# Patient Record
Sex: Male | Born: 1935
Health system: Southern US, Community
[De-identification: ages and names within clinical notes are randomized; demographics above are authoritative.]

## PROBLEM LIST (undated history)

## (undated) DIAGNOSIS — E7800 Pure hypercholesterolemia, unspecified: Secondary | ICD-10-CM

## (undated) DIAGNOSIS — I10 Essential (primary) hypertension: Secondary | ICD-10-CM

## (undated) DIAGNOSIS — C61 Malignant neoplasm of prostate: Secondary | ICD-10-CM

## (undated) DIAGNOSIS — I493 Ventricular premature depolarization: Secondary | ICD-10-CM

## (undated) DIAGNOSIS — I471 Supraventricular tachycardia: Secondary | ICD-10-CM

## (undated) DIAGNOSIS — N320 Bladder-neck obstruction: Secondary | ICD-10-CM

## (undated) HISTORY — PX: PROSTATECTOMY: SHX69

## (undated) HISTORY — DX: Supraventricular tachycardia: I47.1

## (undated) HISTORY — DX: Essential (primary) hypertension: I10

## (undated) HISTORY — DX: Pure hypercholesterolemia, unspecified: E78.00

## (undated) HISTORY — DX: Ventricular premature depolarization: I49.3

## (undated) HISTORY — DX: Bladder-neck obstruction: N32.0

## (undated) HISTORY — PX: HEMORRHOID SURGERY: SHX153

## (undated) HISTORY — DX: Malignant neoplasm of prostate: C61

---

## 1999-09-08 ENCOUNTER — Encounter: Payer: Self-pay | Admitting: Emergency Medicine

## 1999-09-08 ENCOUNTER — Emergency Department (HOSPITAL_COMMUNITY): Admission: EM | Admit: 1999-09-08 | Discharge: 1999-09-08 | Payer: Self-pay | Admitting: Emergency Medicine

## 2002-06-15 ENCOUNTER — Emergency Department (HOSPITAL_COMMUNITY): Admission: EM | Admit: 2002-06-15 | Discharge: 2002-06-15 | Payer: Self-pay | Admitting: *Deleted

## 2003-03-21 ENCOUNTER — Encounter: Admission: RE | Admit: 2003-03-21 | Discharge: 2003-03-21 | Payer: Self-pay | Admitting: Urology

## 2003-03-21 ENCOUNTER — Encounter: Payer: Self-pay | Admitting: Urology

## 2004-09-30 ENCOUNTER — Ambulatory Visit (HOSPITAL_BASED_OUTPATIENT_CLINIC_OR_DEPARTMENT_OTHER): Admission: RE | Admit: 2004-09-30 | Discharge: 2004-09-30 | Payer: Self-pay | Admitting: Urology

## 2004-09-30 ENCOUNTER — Ambulatory Visit (HOSPITAL_COMMUNITY): Admission: RE | Admit: 2004-09-30 | Discharge: 2004-09-30 | Payer: Self-pay | Admitting: Urology

## 2004-09-30 HISTORY — PX: CYSTOURETHROSCOPY: SHX476

## 2005-09-09 ENCOUNTER — Ambulatory Visit (HOSPITAL_COMMUNITY): Admission: RE | Admit: 2005-09-09 | Discharge: 2005-09-09 | Payer: Self-pay | Admitting: Urology

## 2005-09-30 ENCOUNTER — Ambulatory Visit: Admission: RE | Admit: 2005-09-30 | Discharge: 2005-10-29 | Payer: Self-pay | Admitting: Radiation Oncology

## 2006-10-16 ENCOUNTER — Ambulatory Visit (HOSPITAL_COMMUNITY): Admission: RE | Admit: 2006-10-16 | Discharge: 2006-10-16 | Payer: Self-pay | Admitting: Urology

## 2007-03-18 ENCOUNTER — Ambulatory Visit (HOSPITAL_BASED_OUTPATIENT_CLINIC_OR_DEPARTMENT_OTHER): Admission: RE | Admit: 2007-03-18 | Discharge: 2007-03-18 | Payer: Self-pay | Admitting: Urology

## 2007-03-18 ENCOUNTER — Encounter (INDEPENDENT_AMBULATORY_CARE_PROVIDER_SITE_OTHER): Payer: Self-pay | Admitting: Specialist

## 2007-03-18 HISTORY — PX: CYSTOURETHROSCOPY: SHX476

## 2010-09-27 ENCOUNTER — Ambulatory Visit: Payer: Self-pay | Admitting: Cardiology

## 2011-01-24 ENCOUNTER — Ambulatory Visit (INDEPENDENT_AMBULATORY_CARE_PROVIDER_SITE_OTHER): Payer: Medicare Other | Admitting: Cardiology

## 2011-01-24 DIAGNOSIS — Z79899 Other long term (current) drug therapy: Secondary | ICD-10-CM

## 2011-01-24 DIAGNOSIS — E7800 Pure hypercholesterolemia, unspecified: Secondary | ICD-10-CM

## 2011-01-24 DIAGNOSIS — I119 Hypertensive heart disease without heart failure: Secondary | ICD-10-CM

## 2011-03-28 NOTE — Op Note (Signed)
NAMEHALIM, Vincent Perry NO.:  [id number]   MEDICAL RECORD NO.:  [id number]          PATIENT TYPE:  AMB   LOCATION:  [facility]                         FACILITY:  [facility]   PHYSICIAN:  Sigmund I. Patsi Sears, M.D.DATE OF BIRTH:  1932/02/17   DATE OF PROCEDURE:  09/30/2004  DATE OF DISCHARGE:                                 OPERATIVE REPORT   PREOPERATIVE DIAGNOSES:  Dense bladder neck contracture and urethral  submeatal stricture.   POSTOPERATIVE DIAGNOSES:  Dense bladder neck contracture and urethral  submeatal stricture.   OPERATION:  1.  Cystourethroscopy.  2.  Retrograde urethrogram.  3.  Urethral submeatal dilation.  4.  Balloon dilation of dense bladder neck contracture.  5.  Collins knife incision of bladder neck contracture.   SURGEON:  Sigmund I. Patsi Sears, M.D.   ANESTHESIA:  General LMA.   PREPARATION:  After appropriate preanesthesia, the patient is brought to the  operating room and placed on the operating table in the dorsal supine  position where general LMA anesthesia was introduced.  The patient was then  placed in the dorsal lithotomy position where the pubis was prepped with  Betadine solution and draped in the usual fashion.   REVIEW OF HISTORY:  This 77 year old white widowed male was recently seen  with severe urethral stricture disease.  The patient has a history of  adenocarcinoma of the prostate, status post radical perineal prostatectomy  at [facility] several years ago.  He has had recurrent prostate  cancer, on Lupron and Zoladex therapy.  The patient has had a postvoid  residual of 328 mL and is now for release of bladder neck contracture and  dilation of dense urethral stricture.   DESCRIPTION OF PROCEDURE:  The urethral meatus and submeatal areas were  dilated to a size 30 [place] with the mayo sounds.  There was dense urethral  stricture which would not admit a size 24 cystoscope.  A 21 cystoscope was  then passed  proximally to the level of the previous urethral anastomosis and  bladder neck contracture, and urethral stricture and severe bladder neck  contracture were identified.  Retrograde urethrogram showed only a bare  trickle of contrast through the bladder neck into the bladder, and the 21  scope could not be passed into the severely contracted bladder neck.  Therefore, a guidewire was passed; balloon dilation was accomplished for 7  minutes with 18 atmospheres pressure.  Following this, a repeat cystoscopy  was accomplished and showed a slightly dilated bladder neck which still  would not admit the resectoscope.  Therefore, a Collins knife was used to  make an incision at the 6 o'clock position, to cut the bladder neck  contracture, to allow passage of the 24 scope.  Completion of the bladder  neck contracture incision was accomplished.  Irrigation of any clots was  accomplished, and a size 24  [place] was passed over the guidewire into the bladder with a 30 mL  balloon.  Minimal bleeding was noted at the end of the case, although the  urine was rosy in color.  The patient had been given a B&O suppository, was  given Toradol, awakened, and taken to the recovery room in good condition.     Sigm   SIT/MEDQ  D:  09/30/2004  T:  09/30/2004  Job:  [id number]

## 2011-03-28 NOTE — Op Note (Signed)
Vincent Perry, Vincent Perry                   ACCOUNT NO.:  [id number]   MEDICAL RECORD NO.:  [id number]          PATIENT TYPE:  AMB   LOCATION:  [facility]                         FACILITY:  [facility]   PHYSICIAN:  Sigmund I. Tannenbaum, M.D.DATE OF BIRTH:  1932/05/06   DATE OF PROCEDURE:  03/18/2007  DATE OF DISCHARGE:                               OPERATIVE REPORT   PREOPERATIVE DIAGNOSIS:  Adenocarcinoma of the prostate with proximal  urethral stricture, and reactivation of disease.   POSTOPERATIVE DIAGNOSIS:  Adenocarcinoma of the prostate with proximal  urethral stricture, and reactivation of disease.   OPERATION PERFORMED:  Cystourethroscopy, balloon dilation of proximal  urethral stricture, cystoscopy, and transurethral incision of proximal  urethral stricture, bladder neck contracture, with urethral biopsy.   SURGEON:  Jethro Bolus, M.D.   ANESTHESIA:  General LMA.   PREOPERATION:  After appropriate preanesthesia, the patient was brought  to the operating room and placed on the operating table in the dorsal  supine position where general LMA anesthesia was introduced.  He was  then replaced in the dorsal lithotomy position where the pubis was  prepped with Betadine solution and draped in the usual fashion.   REVIEW OF HISTORY:  The patient is a 77 year old male, 16 years status  post radical retropubic prostatectomy, with prostate specific antigen  reactivation of disease, treated with hormone therapy.  The patient has  recently developed difficulty voiding, and cystoscopy was accomplished  in the office, with a very, very tight proximal urethra, and very  narrowed urethra.  A small Foley catheter was placed in the office, the  patient is now for a more complete evaluation and treatment.   MEDICAL HISTORY:  His medical history significant for hypertension and  paroxysmal atrial tachycardia.   DESCRIPTION OF PROCEDURE:  Cystourethroscopy was accomplished and shows  that the  patient has a normal distal pendulous urethra.  Proximally,  however, the patient is noted to have dense urethral stricture disease.  The guidewire was passed through the strictured area, as it would not  allow a 21 cystoscope sheath to pass.  The [facility] balloon dilator was then  passed, and circumferential balloon dilation was accomplished over a  five minute period.  When first placed, there was a definite waist in  the balloon dilator, which definitely which opened with balloon  dilation.  Following dilation, a 28 resectoscope sheath was passed, but  could not be passed through the strictured area, however, the Colling's  knife could be extended, and stricture incised.  I was afraid to cut too  much for fear of urinary incontinence.  Biopsies were taken of the  urethral tissue to rule out recurrent adenocarcinoma intraurethrally.   An incision was accomplished, but still the 28 scope could not be easily  passed in the bladder, however, the 23 sheath could be passed into the  bladder.  I elected to cystoscope the patient, which showed no evidence  of bladder stone, tumor, or diverticular formation.  There was clear  efflux from both orifices.  Elected then to place a size 20  Foley catheter, with 15  cc in the balloon, including 5 cc of contrast to  ensure that the balloon was in the correct position.  The catheter  drained well.  The guidewire was removed.  The patient was awakened  after given 15 mg of IV Toradol, and taken to the recovery room in good  condition.      Sigmund I. Patsi Sears, M.D.  Electronically Signed     SIT/MEDQ  D:  03/18/2007  T:  03/18/2007  Job:  [id number]   cc:   Vonzell Schlatter. Patsi Sears, M.D.  Fax: [phone]

## 2011-09-01 ENCOUNTER — Other Ambulatory Visit: Payer: Self-pay | Admitting: *Deleted

## 2011-09-01 MED ORDER — DOXAZOSIN MESYLATE 2 MG PO TABS: ORAL_TABLET | ORAL | Status: DC

## 2011-09-01 NOTE — Telephone Encounter (Signed)
Refilled meds per fax request.  

## 2011-09-08 ENCOUNTER — Other Ambulatory Visit: Payer: Self-pay | Admitting: *Deleted

## 2011-09-08 MED ORDER — TRIAMTERENE-HCTZ 37.5-25 MG PO TABS: 1.0000 | ORAL_TABLET | Freq: Every day | ORAL | Status: DC

## 2011-11-03 ENCOUNTER — Other Ambulatory Visit: Payer: Self-pay | Admitting: *Deleted

## 2011-11-03 MED ORDER — METOPROLOL TARTRATE 100 MG PO TABS: 50.0000 mg | ORAL_TABLET | Freq: Every day | ORAL | Status: DC

## 2011-11-03 NOTE — Telephone Encounter (Signed)
Refilled metoprolol 

## 2011-12-22 ENCOUNTER — Encounter: Payer: Self-pay | Admitting: *Deleted

## 2011-12-23 ENCOUNTER — Encounter: Payer: Self-pay | Admitting: Cardiology

## 2011-12-23 ENCOUNTER — Ambulatory Visit (INDEPENDENT_AMBULATORY_CARE_PROVIDER_SITE_OTHER): Payer: Medicare Other | Admitting: Cardiology

## 2011-12-23 DIAGNOSIS — E7800 Pure hypercholesterolemia, unspecified: Secondary | ICD-10-CM | POA: Diagnosis not present

## 2011-12-23 DIAGNOSIS — I119 Hypertensive heart disease without heart failure: Secondary | ICD-10-CM

## 2011-12-23 DIAGNOSIS — C61 Malignant neoplasm of prostate: Secondary | ICD-10-CM | POA: Diagnosis not present

## 2011-12-23 LAB — BASIC METABOLIC PANEL
BUN: 17 mg/dL (ref 6–23)
Creatinine, Ser: 1.1 mg/dL (ref 0.4–1.5)
GFR: 71.02 mL/min (ref >60.00)
Glucose, Bld: 102 mg/dL — ABNORMAL HIGH (ref 70–99)
Potassium: 4.6 mEq/L (ref 3.5–5.1)

## 2011-12-23 LAB — HEPATIC FUNCTION PANEL
ALT: 17 U/L (ref 0–53)
Alkaline Phosphatase: 69 U/L (ref 39–117)
Bilirubin, Direct: 0 mg/dL (ref 0.0–0.3)
Total Bilirubin: 0.7 mg/dL (ref 0.3–1.2)
Total Protein: 6.7 g/dL (ref 6.0–8.3)

## 2011-12-23 LAB — LIPID PANEL: HDL: 41.1 mg/dL (ref >39.00)

## 2011-12-23 NOTE — Assessment & Plan Note (Signed)
His medication list states that he is on both lovastatin and Crestor.  He should only be on one or the other.  He will check at home and see which one he is on.  We are checking fasting lipids today.  He is not having any myalgias etc..

## 2011-12-23 NOTE — Assessment & Plan Note (Signed)
The patient denies any dizziness or syncope.  No headaches.  He does not have any history of ischemic heart disease.  He had a normal nuclear stress test in 2005.  He continues to work full time.  He works for himself and builds rental houses.

## 2011-12-23 NOTE — Progress Notes (Signed)
Vincent Perry Date of Birth:  July 23, 1932 [facility] [address] [place], [place]  [address] [phone]  Fax   [phone]  HPI: This pleasant 78 year old gentleman is seen for a scheduled 6 month followup office visit.  He has a history of essential hypertension and hypercholesterolemia he has a remote history of prostate cancer 20 years ago.  Since last visit he has been doing well.  He is not having any chest pain or shortness of breath.  Current Outpatient Prescriptions  Medication Sig Dispense Refill  . aspirin 325 MG tablet Take 325 mg by mouth daily.      Marland Kitchen doxazosin (CARDURA) 2 MG tablet 1/2 tablet daily  90 tablet  0  . lovastatin (MEVACOR) 20 MG tablet Take 20 mg by mouth at bedtime.        . metoprolol (LOPRESSOR) 100 MG tablet Take 0.5 tablets (50 mg total) by mouth daily.  30 tablet  5  . rosuvastatin (CRESTOR) 10 MG tablet Take 10 mg by mouth daily.      Marland Kitchen triamterene-hydrochlorothiazide (MAXZIDE-25) 37.5-25 MG per tablet Take 1 each (1 tablet total) by mouth daily.  90 tablet  3    No Known Allergies  Patient Active Problem List  Diagnoses  . Benign hypertensive heart disease without heart failure  . Pure hypercholesterolemia  . Prostate cancer    History  Smoking status  . Never Smoker   Smokeless tobacco  . Not on file    History  Alcohol Use No    Family History  Problem Relation Age of Onset  . Heart disease Father   . Heart attack Father   . Diabetes Mother   . Hypertension Sister   . Heart attack Sister   . Cancer Brother     Review of Systems: The patient denies any heat or cold intolerance.  No weight gain or weight loss.  The patient denies headaches or blurry vision.  There is no cough or sputum production.  The patient denies dizziness.  There is no hematuria or hematochezia.  The patient denies any muscle aches or arthritis.  The patient denies any rash.  The patient denies frequent falling or instability.  There  is no history of depression or anxiety.  All other systems were reviewed and are negative.   Physical Exam: Filed Vitals:   12/23/11 1017  BP: 121/76  Pulse: 72   the general appearance reveals a tall well-nourished gentleman in no distress.The head and neck exam reveals pupils equal and reactive.  Extraocular movements are full.  There is no scleral icterus.  The mouth and pharynx are normal.  The neck is supple.  The carotids reveal no bruits.  The jugular venous pressure is normal.  The  thyroid is not enlarged.  There is no lymphadenopathy.  The chest is clear to percussion and auscultation.  There are no rales or rhonchi.  Expansion of the chest is symmetrical.  The precordium is quiet.  The first heart sound is normal.  The second heart sound is physiologically split.  There is no murmur gallop rub or click.  There is no abnormal lift or heave.  The abdomen is soft and nontender.  The bowel sounds are normal.  The liver and spleen are not enlarged.  There are no abdominal masses.  There are no abdominal bruits.  Extremities reveal good pedal pulses.  There is no phlebitis or edema.  There is no cyanosis or clubbing.  Strength is normal  and symmetrical in all extremities.  There is no lateralizing weakness.  There are no sensory deficits.  The skin is warm and dry.  There is no rash.      Assessment / Plan: Continue same medication.  Recheck in 6 months for followup office visit and fasting lab work.

## 2011-12-23 NOTE — Patient Instructions (Signed)
Your physician recommends that you continue on your current medications as directed. Please refer to the Current Medication list given to you today. Will obtain labs today and call you with the results(LP/HFP/BMET) Your physician wants you to follow-up in: 6 months You will receive a reminder letter in the mail two months in advance. If you don't receive a letter, please call our office to schedule the follow-up appointment.

## 2011-12-24 ENCOUNTER — Telehealth: Payer: Self-pay | Admitting: *Deleted

## 2011-12-24 NOTE — Telephone Encounter (Signed)
Advised of labs.  Not sure which medication taking, called pharmacy and he is taking Lovastatin

## 2011-12-24 NOTE — Telephone Encounter (Signed)
Message copied by Burnell Blanks on Wed Dec 24, 2011  3:10 PM ------      Message from: Cassell Clement      Created: Tue Dec 23, 2011  3:19 PM       Please report.  The labs are stable.  Continue same meds.  Continue careful diet.      TGs and BS up slightly so watch carbs.      Find out whether he is taking the lovastatin or the Crestor and correct his med list accordingly.  Thanks.

## 2012-03-02 ENCOUNTER — Other Ambulatory Visit: Payer: Self-pay | Admitting: Cardiology

## 2012-04-13 ENCOUNTER — Other Ambulatory Visit: Payer: Self-pay | Admitting: Cardiology

## 2012-04-14 NOTE — Telephone Encounter (Signed)
Refilled generic cardura

## 2012-04-20 DIAGNOSIS — C61 Malignant neoplasm of prostate: Secondary | ICD-10-CM | POA: Diagnosis not present

## 2012-04-27 DIAGNOSIS — E291 Testicular hypofunction: Secondary | ICD-10-CM | POA: Diagnosis not present

## 2012-04-27 DIAGNOSIS — R972 Elevated prostate specific antigen [PSA]: Secondary | ICD-10-CM | POA: Diagnosis not present

## 2012-04-27 DIAGNOSIS — C61 Malignant neoplasm of prostate: Secondary | ICD-10-CM | POA: Diagnosis not present

## 2012-07-30 DIAGNOSIS — C61 Malignant neoplasm of prostate: Secondary | ICD-10-CM | POA: Diagnosis not present

## 2012-08-16 ENCOUNTER — Other Ambulatory Visit: Payer: Self-pay | Admitting: Cardiology

## 2012-11-11 ENCOUNTER — Other Ambulatory Visit: Payer: Self-pay

## 2012-11-11 MED ORDER — TRIAMTERENE-HCTZ 37.5-25 MG PO TABS: 1.0000 | ORAL_TABLET | Freq: Every day | ORAL | Status: DC

## 2012-11-11 MED ORDER — METOPROLOL TARTRATE 100 MG PO TABS: 50.0000 mg | ORAL_TABLET | Freq: Every day | ORAL | Status: DC

## 2012-12-31 ENCOUNTER — Ambulatory Visit (INDEPENDENT_AMBULATORY_CARE_PROVIDER_SITE_OTHER): Payer: Medicare Other | Admitting: Cardiology

## 2012-12-31 ENCOUNTER — Encounter: Payer: Self-pay | Admitting: Cardiology

## 2012-12-31 VITALS — BP 124/73 | HR 64 | Ht 70.0 in | Wt 199.0 lb

## 2012-12-31 DIAGNOSIS — I4949 Other premature depolarization: Secondary | ICD-10-CM

## 2012-12-31 DIAGNOSIS — E7800 Pure hypercholesterolemia, unspecified: Secondary | ICD-10-CM

## 2012-12-31 DIAGNOSIS — I493 Ventricular premature depolarization: Secondary | ICD-10-CM

## 2012-12-31 DIAGNOSIS — I119 Hypertensive heart disease without heart failure: Secondary | ICD-10-CM

## 2012-12-31 NOTE — Patient Instructions (Addendum)
Will obtain labs today and call you with the results (lp/bmet/hfp)  Your physician recommends that you continue on your current medications as directed. Please refer to the Current Medication list given to you today.  Your physician wants you to follow-up in: 6 months with fasting labs (lp/bmet/hfp)  You will receive a reminder letter in the mail two months in advance. If you don't receive a letter, please call our office to schedule the follow-up appointment.  

## 2012-12-31 NOTE — Progress Notes (Signed)
Vincent Perry Date of Birth:  08-09-1932 [facility] [address] [place], [place]  [address] [phone]         Fax   [phone]  History of Present Illness: This pleasant 79 year old gentleman is seen for a scheduled 6 month followup office visit. He has a history of essential hypertension and hypercholesterolemia he has a remote history of prostate cancer 20 years ago. Since last visit he has been doing well. He is not having any chest pain or shortness of breath.  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   Current Outpatient Prescriptions  Medication Sig Dispense Refill  . aspirin 325 MG tablet Take 325 mg by mouth daily.      Marland Kitchen doxazosin (CARDURA) 2 MG tablet TAKE 1/2 TABLET DAILY  90 tablet  2  . lovastatin (MEVACOR) 20 MG tablet TAKE 1 TABLET EVERY DAY  30 tablet  11  . metoprolol (LOPRESSOR) 100 MG tablet Take 0.5 tablets (50 mg total) by mouth daily.  30 tablet  2  . triamterene-hydrochlorothiazide (MAXZIDE-25) 37.5-25 MG per tablet Take 1 each (1 tablet total) by mouth daily.  90 tablet  0   No current facility-administered medications for this visit.    No Known Allergies  Patient Active Problem List  Diagnosis  . Benign hypertensive heart disease without heart failure  . Pure hypercholesterolemia  . Prostate cancer  . Asymptomatic PVCs    History  Smoking status  . Never Smoker   Smokeless tobacco  . Not on file    History  Alcohol Use No    Family History  Problem Relation Age of Onset  . Heart disease Father   . Heart attack Father   . Diabetes Mother   . Hypertension Sister   . Heart attack Sister   . Cancer Brother     Review of Systems: Constitutional: no fever chills diaphoresis or fatigue or change in weight.  Head and neck: no hearing loss, no epistaxis, no photophobia or visual disturbance. Respiratory: No cough, shortness of breath or wheezing. Cardiovascular: No chest  pain peripheral edema, palpitations. Gastrointestinal: No abdominal distention, no abdominal pain, no change in bowel habits hematochezia or melena. Genitourinary: No dysuria, no frequency, no urgency, no nocturia. Musculoskeletal:No arthralgias, no back pain, no gait disturbance or myalgias. Neurological: No dizziness, no headaches, no numbness, no seizures, no syncope, no weakness, no tremors. Hematologic: No lymphadenopathy, no easy bruising. Psychiatric: No confusion, no hallucinations, no sleep disturbance.    Physical Exam: Filed Vitals:   12/31/12 1635  BP: 124/73  Pulse: 64   the general appearance reveals a well-developed well-nourished gentleman in no distress.  He looks younger than his stated age.The head and neck exam reveals pupils equal and reactive.  Extraocular movements are full.  There is no scleral icterus.  The mouth and pharynx are normal.  The neck is supple.  The carotids reveal no bruits.  The jugular venous pressure is normal.  The  thyroid is not enlarged.  There is no lymphadenopathy.  The chest is clear to percussion and auscultation.  There are no rales or rhonchi.  Expansion of the chest is symmetrical.  The precordium is quiet.  The first heart sound is normal.  The second heart sound is physiologically split.  There is no murmur gallop rub or click.  There is no abnormal lift or heave.  The abdomen is soft and nontender.  The bowel sounds are  normal.  The liver and spleen are not enlarged.  There are no abdominal masses.  There are no abdominal bruits.  Extremities reveal good pedal pulses.  There is no phlebitis or edema.  There is no cyanosis or clubbing.  Strength is normal and symmetrical in all extremities.  There is no lateralizing weakness.  There are no sensory deficits.  The skin is warm and dry.  There is no rash.  EKG shows normal sinus rhythm with frequent unifocal PVCs and no ischemic changes   Assessment / Plan:  Stay on same medication blood work  today is pending  recheck in 6 months for followup office visit fasting lipid panel hepatic function panel and basal metabolic panel.Marland Kitchen

## 2012-12-31 NOTE — Assessment & Plan Note (Signed)
EKG today shows frequent unifocal PVCs.  These are asymptomatic.  The patient has not been having any chest pain or shortness of breath.  No dizziness or syncope or palpitations

## 2012-12-31 NOTE — Assessment & Plan Note (Signed)
Blood pressure has been remaining stable on current therapy.  No headaches or dizziness.  He tries to avoid excessive dietary salt.  He quit smoking about 20 years ago fortunately

## 2012-12-31 NOTE — Assessment & Plan Note (Signed)
The patient has a history of hypercholesterolemia and is on lovastatin.  He has not had his blood work checked for the past year.  We will check it today

## 2013-01-01 LAB — HEPATIC FUNCTION PANEL
AST: 22 U/L (ref 0–37)
Albumin: 4.2 g/dL (ref 3.5–5.2)
Alkaline Phosphatase: 74 U/L (ref 39–117)
Bilirubin, Direct: <0.1 mg/dL (ref 0.0–0.3)
Total Bilirubin: 0.4 mg/dL (ref 0.3–1.2)

## 2013-01-01 LAB — LIPID PANEL
Cholesterol: 157 mg/dL (ref 0–200)
HDL: 32 mg/dL — ABNORMAL LOW
Total CHOL/HDL Ratio: 4.9 ratio
Triglycerides: 572 mg/dL — ABNORMAL HIGH

## 2013-01-01 LAB — BASIC METABOLIC PANEL
BUN: 25 mg/dL — ABNORMAL HIGH (ref 6–23)
CO2: 28 mEq/L (ref 19–32)
Calcium: 9.6 mg/dL (ref 8.4–10.5)
Creat: 1.26 mg/dL (ref 0.50–1.35)
Glucose, Bld: 59 mg/dL — ABNORMAL LOW (ref 70–99)

## 2013-01-03 NOTE — Progress Notes (Signed)
Quick Note:  Please report to patient. The recent labs are stable. Continue same medication and careful diet. The triglycerides are too high and the HDL is low. Needs to avoid carbohydrates and to lose weight to help the triglycerides. Continue lovastatin and add fish oil 1000 mg daily ______

## 2013-01-26 DIAGNOSIS — N390 Urinary tract infection, site not specified: Secondary | ICD-10-CM | POA: Diagnosis not present

## 2013-01-31 DIAGNOSIS — C61 Malignant neoplasm of prostate: Secondary | ICD-10-CM | POA: Diagnosis not present

## 2013-01-31 DIAGNOSIS — N390 Urinary tract infection, site not specified: Secondary | ICD-10-CM | POA: Diagnosis not present

## 2013-01-31 DIAGNOSIS — N3941 Urge incontinence: Secondary | ICD-10-CM | POA: Diagnosis not present

## 2013-01-31 DIAGNOSIS — R972 Elevated prostate specific antigen [PSA]: Secondary | ICD-10-CM | POA: Diagnosis not present

## 2013-03-04 ENCOUNTER — Other Ambulatory Visit: Payer: Self-pay

## 2013-03-04 MED ORDER — LOVASTATIN 20 MG PO TABS: ORAL_TABLET | ORAL | Status: DC

## 2013-03-04 MED ORDER — TRIAMTERENE-HCTZ 37.5-25 MG PO TABS: 1.0000 | ORAL_TABLET | Freq: Every day | ORAL | Status: DC

## 2013-03-04 NOTE — Telephone Encounter (Signed)
..   Requested Prescriptions   Signed Prescriptions Disp Refills  . lovastatin (MEVACOR) 20 MG tablet 30 tablet 11    Sig: TAKE 1 TABLET EVERY DAY    Authorizing Provider: Cassell Clement    Ordering User: Christella Hartigan, Shawntae Lowy Judie Petit

## 2013-03-04 NOTE — Telephone Encounter (Signed)
..   Requested Prescriptions   Signed Prescriptions Disp Refills  . triamterene-hydrochlorothiazide (MAXZIDE-25) 37.5-25 MG per tablet 90 tablet 2    Sig: Take 1 tablet by mouth daily.    Authorizing Provider: Cassell Clement    Ordering User: Christella Hartigan, Tryphena Perkovich Judie Petit

## 2013-03-14 DIAGNOSIS — L570 Actinic keratosis: Secondary | ICD-10-CM | POA: Diagnosis not present

## 2013-03-14 DIAGNOSIS — D485 Neoplasm of uncertain behavior of skin: Secondary | ICD-10-CM | POA: Diagnosis not present

## 2013-03-14 DIAGNOSIS — L723 Sebaceous cyst: Secondary | ICD-10-CM | POA: Diagnosis not present

## 2013-04-07 ENCOUNTER — Other Ambulatory Visit: Payer: Self-pay | Admitting: *Deleted

## 2013-04-07 MED ORDER — LOVASTATIN 20 MG PO TABS: ORAL_TABLET | ORAL | Status: DC

## 2013-06-11 ENCOUNTER — Other Ambulatory Visit: Payer: Self-pay | Admitting: Cardiology

## 2013-08-02 DIAGNOSIS — R972 Elevated prostate specific antigen [PSA]: Secondary | ICD-10-CM | POA: Diagnosis not present

## 2013-08-09 DIAGNOSIS — C61 Malignant neoplasm of prostate: Secondary | ICD-10-CM | POA: Diagnosis not present

## 2013-08-09 DIAGNOSIS — E291 Testicular hypofunction: Secondary | ICD-10-CM | POA: Diagnosis not present

## 2013-08-09 DIAGNOSIS — R972 Elevated prostate specific antigen [PSA]: Secondary | ICD-10-CM | POA: Diagnosis not present

## 2013-08-27 ENCOUNTER — Other Ambulatory Visit: Payer: Self-pay | Admitting: Cardiology

## 2013-09-12 ENCOUNTER — Other Ambulatory Visit: Payer: Self-pay | Admitting: Dermatology

## 2013-09-12 DIAGNOSIS — D485 Neoplasm of uncertain behavior of skin: Secondary | ICD-10-CM | POA: Diagnosis not present

## 2013-09-12 DIAGNOSIS — C44319 Basal cell carcinoma of skin of other parts of face: Secondary | ICD-10-CM | POA: Diagnosis not present

## 2013-09-12 DIAGNOSIS — L570 Actinic keratosis: Secondary | ICD-10-CM | POA: Diagnosis not present

## 2013-09-17 ENCOUNTER — Other Ambulatory Visit: Payer: Self-pay | Admitting: Cardiology

## 2013-10-05 ENCOUNTER — Encounter: Payer: Self-pay | Admitting: Cardiology

## 2013-10-05 ENCOUNTER — Ambulatory Visit (INDEPENDENT_AMBULATORY_CARE_PROVIDER_SITE_OTHER): Payer: Medicare Other | Admitting: Cardiology

## 2013-10-05 VITALS — BP 131/65 | HR 70 | Ht 70.0 in | Wt 198.4 lb

## 2013-10-05 DIAGNOSIS — I119 Hypertensive heart disease without heart failure: Secondary | ICD-10-CM | POA: Diagnosis not present

## 2013-10-05 DIAGNOSIS — E7800 Pure hypercholesterolemia, unspecified: Secondary | ICD-10-CM | POA: Diagnosis not present

## 2013-10-05 DIAGNOSIS — I4949 Other premature depolarization: Secondary | ICD-10-CM

## 2013-10-05 DIAGNOSIS — I493 Ventricular premature depolarization: Secondary | ICD-10-CM | POA: Insufficient documentation

## 2013-10-05 LAB — LIPID PANEL
Cholesterol: 148 mg/dL (ref 0–200)
Total CHOL/HDL Ratio: 4
Triglycerides: 338 mg/dL — ABNORMAL HIGH (ref 0.0–149.0)
VLDL: 67.6 mg/dL — ABNORMAL HIGH (ref 0.0–40.0)

## 2013-10-05 LAB — BASIC METABOLIC PANEL
CO2: 30 mEq/L (ref 19–32)
Chloride: 103 mEq/L (ref 96–112)
Creatinine, Ser: 1.3 mg/dL (ref 0.4–1.5)
Sodium: 138 mEq/L (ref 135–145)

## 2013-10-05 LAB — HEPATIC FUNCTION PANEL
AST: 19 U/L (ref 0–37)
Albumin: 3.8 g/dL (ref 3.5–5.2)
Alkaline Phosphatase: 79 U/L (ref 39–117)

## 2013-10-05 NOTE — Progress Notes (Signed)
Quick Note:  Please report to patient. The recent labs are stable. Continue same medication and careful diet. Lipids are improved but still too high. Watch carbs. BS is slightly high also. ______

## 2013-10-05 NOTE — Patient Instructions (Signed)
Your physician recommends that you will lab work today:(lipid,liver & Bmet)  Your physician wants you to follow-up in: 6 months You will receive a reminder letter in the mail two months in advance. If you don't receive a letter, please call our office to schedule the follow-up appointment.  Your physician has requested that you have a lexiscan myoview. For further information please visit [email or web address] Please follow instruction sheet, as given. WALKING LEXISCAN.  Your physician recommends that you continue on your current medications as directed. Please refer to the Current Medication list given to you today.

## 2013-10-05 NOTE — Progress Notes (Signed)
Vincent Perry Date of Birth:  Oct 25, 1932 [id number] [facility] [address] [place], [place]  [address] [phone]         Fax   [phone]  History of Present Illness: This pleasant 79 year old gentleman is seen for a scheduled 6 month followup office visit. He has a history of essential hypertension and hypercholesterolemia he has a remote history of prostate cancer 20 years ago.  He has a history of PVCs.  He has been worrying more about his heart recently after his best friend just died of sudden cardiac death.  He has a strong family history of heart disease.  His father had hardening of the arteries.  He had a sister age 14 who died of a heart attack and a brother age 79 who died of a heart attack.  He has not been having any dizzy spells.  He said occasional lightheaded spells which she attributes to "hot flashes".  This may be referable to his previous prostate cancer treatment.  He has had some shortness of breath.  He has not been aware of any chest discomfort.  Current Outpatient Prescriptions  Medication Sig Dispense Refill  . aspirin 325 MG tablet Take 325 mg by mouth daily. 1/2 Tablet Daily      . doxazosin (CARDURA) 2 MG tablet TAKE 1/2 TABLET DAILY  90 tablet  0  . lovastatin (MEVACOR) 20 MG tablet TAKE 1 TABLET EVERY DAY  90 tablet  3  . metoprolol (LOPRESSOR) 100 MG tablet TAKE 1/2 TABLET BY MOUTH EVERY DAY **NEEDS OV**  30 tablet  0  . triamterene-hydrochlorothiazide (MAXZIDE-25) 37.5-25 MG per tablet Take 1 tablet by mouth daily.  90 tablet  2   No current facility-administered medications for this visit.    No Known Allergies  Patient Active Problem List   Diagnosis Date Noted  . PVC's (premature ventricular contractions) 10/05/2013  . Benign hypertensive heart disease without heart failure 12/23/2011  . Pure hypercholesterolemia 12/23/2011  . Prostate cancer 12/23/2011    History  Smoking status  . Never Smoker   Smokeless tobacco  . Not on file    History    Alcohol Use No    Family History  Problem Relation Age of Onset  . Heart disease Father   . Heart attack Father   . Diabetes Mother   . Hypertension Sister   . Heart attack Sister   . Cancer Brother     Review of Systems: Constitutional: no fever chills diaphoresis or fatigue or change in weight.  Head and neck: no hearing loss, no epistaxis, no photophobia or visual disturbance. Respiratory: No cough, shortness of breath or wheezing. Cardiovascular: No chest pain peripheral edema, palpitations. Gastrointestinal: No abdominal distention, no abdominal pain, no change in bowel habits hematochezia or melena. Genitourinary: No dysuria, no frequency, no urgency, no nocturia. Musculoskeletal:No arthralgias, no back pain, no gait disturbance or myalgias. Neurological: No dizziness, no headaches, no numbness, no seizures, no syncope, no weakness, no tremors. Hematologic: No lymphadenopathy, no easy bruising. Psychiatric: No confusion, no hallucinations, no sleep disturbance.    Physical Exam: Filed Vitals:   10/05/13 0856  BP: 131/65  Pulse: 70   the general appearance reveals a well-developed well-nourished gentleman in no distress.  He looks younger than his stated age.The head and neck exam reveals pupils equal and reactive.  Extraocular movements are full.  There is no scleral icterus.  The mouth and pharynx are normal.  The neck is supple.  The carotids reveal  no bruits.  The jugular venous pressure is normal.  The  thyroid is not enlarged.  There is no lymphadenopathy.  The chest is clear to percussion and auscultation.  There are no rales or rhonchi.  Expansion of the chest is symmetrical.  The precordium is quiet.  The first heart sound is normal.  The second heart sound is physiologically split.  There is no murmur gallop rub or click.  There is no abnormal lift or heave.  The abdomen is soft and nontender.  The bowel sounds are normal.  The liver and spleen are not enlarged.   There are no abdominal masses.  There are no abdominal bruits.  Extremities reveal good pedal pulses.  There is no phlebitis or edema.  There is no cyanosis or clubbing.  Strength is normal and symmetrical in all extremities.  There is no lateralizing weakness.  There are no sensory deficits.  The skin is warm and dry.  There is no rash.  EKG shows normal sinus rhythm with occasional PVC.   Assessment / Plan:  We're checking lab work today.  We will have him return for an walking Lexa scan Myoview stress test. Recheck in 6 months for followup office visit lipid panel hepatic function panel and basal metabolic panel

## 2013-10-25 ENCOUNTER — Encounter (HOSPITAL_COMMUNITY): Payer: Medicare Other

## 2013-10-31 ENCOUNTER — Other Ambulatory Visit: Payer: Self-pay | Admitting: Cardiology

## 2013-10-31 ENCOUNTER — Encounter: Payer: Self-pay | Admitting: Cardiology

## 2013-10-31 ENCOUNTER — Ambulatory Visit (HOSPITAL_COMMUNITY): Payer: Medicare Other | Attending: Cardiovascular Disease | Admitting: Radiology

## 2013-10-31 VITALS — BP 147/71 | HR 67 | Ht 70.0 in | Wt 198.0 lb

## 2013-10-31 DIAGNOSIS — R079 Chest pain, unspecified: Secondary | ICD-10-CM

## 2013-10-31 DIAGNOSIS — Z8249 Family history of ischemic heart disease and other diseases of the circulatory system: Secondary | ICD-10-CM | POA: Diagnosis not present

## 2013-10-31 DIAGNOSIS — I10 Essential (primary) hypertension: Secondary | ICD-10-CM | POA: Insufficient documentation

## 2013-10-31 DIAGNOSIS — I493 Ventricular premature depolarization: Secondary | ICD-10-CM

## 2013-10-31 DIAGNOSIS — R0602 Shortness of breath: Secondary | ICD-10-CM

## 2013-10-31 DIAGNOSIS — I119 Hypertensive heart disease without heart failure: Secondary | ICD-10-CM

## 2013-10-31 DIAGNOSIS — R42 Dizziness and giddiness: Secondary | ICD-10-CM | POA: Diagnosis not present

## 2013-10-31 DIAGNOSIS — E7800 Pure hypercholesterolemia, unspecified: Secondary | ICD-10-CM

## 2013-10-31 MED ORDER — TECHNETIUM TC 99M SESTAMIBI GENERIC - CARDIOLITE
33.0000 | Freq: Once | INTRAVENOUS | Status: AC | PRN
  Administered 2013-10-31: 33 via INTRAVENOUS

## 2013-10-31 MED ORDER — TECHNETIUM TC 99M SESTAMIBI GENERIC - CARDIOLITE
11.0000 | Freq: Once | INTRAVENOUS | Status: AC | PRN
  Administered 2013-10-31: 11 via INTRAVENOUS

## 2013-10-31 MED ORDER — REGADENOSON 0.4 MG/5ML IV SOLN
0.4000 mg | Freq: Once | INTRAVENOUS | Status: AC
  Administered 2013-10-31: 0.4 mg via INTRAVENOUS

## 2013-10-31 NOTE — Progress Notes (Signed)
[facility] SITE 3 NUCLEAR MED [address] [place], [place] [address] [phone]    Cardiology Nuclear Med Study  Vincent Perry is a 79 y.o. male     MRN : [id number]     DOB: Sep 16, 1932  Procedure Date: 10/31/2013  Nuclear Med Background Indication for Stress Test:  Evaluation for Ischemia History:  No prior cardiac history; MPI 4-5 yrs ago-Normal Cardiac Risk Factors: Family History - CAD, Hypertension and Lipids  Symptoms:  Dizziness, SOB and hx of PVC's   Nuclear Pre-Procedure Caffeine/Decaff Intake:  None NPO After: 6:00pm   Lungs:  clear O2 Sat: 94% on room air. IV 0.9% NS with Angio Cath:  22g  IV Site: R Hand  IV Started by:  Cathlyn Parsons, RN  Chest Size (in):  44 Cup Size: n/a  Height: 5\' 10"  (1.778 m)  Weight:  198 lb (89.812 kg)  BMI:  Body mass index is 28.41 kg/(m^2). Tech Comments:  Held metoprolol 48 hrs    Nuclear Med Study 1 or 2 day study: 1 day  Stress Test Type:  Treadmill/Lexiscan  Reading MD: Cassell Clement, MD  Order Authorizing Provider:  Wylene Simmer, MD  Resting Radionuclide: Technetium 70m Sestamibi  Resting Radionuclide Dose: 10.7 mCi   Stress Radionuclide:  Technetium 4m Sestamibi  Stress Radionuclide Dose: 33.0 mCi           Stress Protocol Rest HR: 67 Stress HR: 118  Rest BP: 147/71 Stress BP: 116/80  Exercise Time (min): n/a METS: n/a           Dose of Adenosine (mg):  n/a Dose of Lexiscan: 0.4 mg  Dose of Atropine (mg): n/a Dose of Dobutamine: n/a mcg/kg/min (at max HR)  Stress Test Technologist: Nelson Chimes, BS-ES  Nuclear Technologist:  Domenic Polite, CNMT     Rest Procedure:  Myocardial perfusion imaging was performed at rest 45 minutes following the intravenous administration of Technetium 17m Sestamibi. Rest ECG: NSR - Normal EKG  Stress Procedure:  The patient received IV Lexiscan 0.4 mg over 15-seconds with concurrent low level exercise and then Technetium 45m Sestamibi was injected at 30-seconds  while the patient continued walking one more minute.  Quantitative spect images were obtained after a 45-minute delay. Stress ECG: No significant change from baseline ECG  QPS Raw Data Images:  Normal; no motion artifact; normal heart/lung ratio. Stress Images:  Normal homogeneous uptake in all areas of the myocardium. Rest Images:  Normal homogeneous uptake in all areas of the myocardium. Subtraction (SDS):  No evidence of ischemia. Transient Ischemic Dilatation (Normal <1.22):  0.85 Lung/Heart Ratio (Normal <0.45):  0.23  Quantitative Gated Spect Images QGS EDV:  81 ml QGS ESV:  30 ml  Impression Exercise Capacity:  Lexiscan with low level exercise. BP Response:  Normal blood pressure response. Clinical Symptoms:  No significant symptoms noted. ECG Impression:  No significant ST segment change suggestive of ischemia. Comparison with Prior Nuclear Study: No images to compare  Overall Impression:  Normal stress nuclear study.  LV Ejection Fraction: 63%.  LV Wall Motion:  NL LV Function; NL Wall Motion  [employer]

## 2013-11-02 ENCOUNTER — Telehealth: Payer: Self-pay | Admitting: *Deleted

## 2013-11-02 NOTE — Telephone Encounter (Signed)
Message copied by Burnell Blanks on Wed Nov 02, 2013  8:53 AM ------      Message from: Cassell Clement      Created: Tue Nov 01, 2013  5:32 PM       Please report.  The nuclear stress test was normal.  No sign of blockage of the coronary arteries.  The ejection fraction was normal at 63%. ------

## 2013-11-02 NOTE — Telephone Encounter (Signed)
Advised patient

## 2013-12-19 ENCOUNTER — Other Ambulatory Visit: Payer: Self-pay | Admitting: Dermatology

## 2013-12-19 DIAGNOSIS — Z85828 Personal history of other malignant neoplasm of skin: Secondary | ICD-10-CM | POA: Diagnosis not present

## 2013-12-19 DIAGNOSIS — L570 Actinic keratosis: Secondary | ICD-10-CM | POA: Diagnosis not present

## 2014-03-08 ENCOUNTER — Other Ambulatory Visit: Payer: Self-pay | Admitting: Cardiology

## 2014-04-04 ENCOUNTER — Telehealth: Payer: Self-pay | Admitting: Cardiology

## 2014-04-04 DIAGNOSIS — E7800 Pure hypercholesterolemia, unspecified: Secondary | ICD-10-CM

## 2014-04-04 NOTE — Telephone Encounter (Signed)
New problem   Left arm very weak right arm is soure.   Pt has headaches off and on. Pt would like to get in today if possible.

## 2014-04-04 NOTE — Telephone Encounter (Signed)
Have the patient stop his lovastatin until we see him next week.  Obtain a total CK level when he returns next week.  Continue to take aspirin daily as directed.

## 2014-04-04 NOTE — Telephone Encounter (Signed)
Patient has had episodes where he does not have any strength in his left arm and sometimes right arm. His last episode was several weeks ago. Patient does have appointment next week. Patients has no PCP. Advised patient if he should have another episode to go to ED or Urgent Care to be evaluated, verbalized understanding.

## 2014-04-04 NOTE — Telephone Encounter (Signed)
Advised patient

## 2014-04-05 ENCOUNTER — Other Ambulatory Visit: Payer: Self-pay | Admitting: Cardiology

## 2014-04-14 ENCOUNTER — Encounter: Payer: Self-pay | Admitting: Cardiology

## 2014-04-14 ENCOUNTER — Ambulatory Visit (INDEPENDENT_AMBULATORY_CARE_PROVIDER_SITE_OTHER): Payer: Medicare Other | Admitting: Cardiology

## 2014-04-14 ENCOUNTER — Ambulatory Visit: Payer: Medicare Other | Admitting: Cardiology

## 2014-04-14 VITALS — BP 134/78 | HR 72 | Ht 70.0 in | Wt 200.0 lb

## 2014-04-14 DIAGNOSIS — C61 Malignant neoplasm of prostate: Secondary | ICD-10-CM

## 2014-04-14 DIAGNOSIS — E7800 Pure hypercholesterolemia, unspecified: Secondary | ICD-10-CM | POA: Diagnosis not present

## 2014-04-14 DIAGNOSIS — I119 Hypertensive heart disease without heart failure: Secondary | ICD-10-CM | POA: Diagnosis not present

## 2014-04-14 LAB — LIPID PANEL
Cholesterol: 163 mg/dL (ref 0–200)
HDL: 29.3 mg/dL — AB (ref >39.00)
LDL Cholesterol: 59 mg/dL (ref 0–99)
NONHDL: 133.7
Total CHOL/HDL Ratio: 6
Triglycerides: 376 mg/dL — ABNORMAL HIGH (ref 0.0–149.0)
VLDL: 75.2 mg/dL — AB (ref 0.0–40.0)

## 2014-04-14 LAB — BASIC METABOLIC PANEL
BUN: 14 mg/dL (ref 6–23)
CHLORIDE: 105 meq/L (ref 96–112)
CO2: 30 meq/L (ref 19–32)
Calcium: 9.2 mg/dL (ref 8.4–10.5)
Creatinine, Ser: 1 mg/dL (ref 0.4–1.5)
GFR: 79.07 mL/min (ref >60.00)
Glucose, Bld: 89 mg/dL (ref 70–99)
Potassium: 4 mEq/L (ref 3.5–5.1)
SODIUM: 141 meq/L (ref 135–145)

## 2014-04-14 LAB — HEPATIC FUNCTION PANEL
ALBUMIN: 3.5 g/dL (ref 3.5–5.2)
ALT: 14 U/L (ref 0–53)
AST: 18 U/L (ref 0–37)
Alkaline Phosphatase: 74 U/L (ref 39–117)
BILIRUBIN TOTAL: 0.6 mg/dL (ref 0.2–1.2)
Bilirubin, Direct: 0.1 mg/dL (ref 0.0–0.3)
Total Protein: 6.1 g/dL (ref 6.0–8.3)

## 2014-04-14 LAB — CK: Total CK: 43 U/L (ref 7–232)

## 2014-04-14 NOTE — Assessment & Plan Note (Signed)
The patient has a past history of prostate cancer followed at [facility].  He is now being followed locally here by Dr. Era Bumpers.  Dr. Era Bumpers has told him that his prostate cancer situation is under control.

## 2014-04-14 NOTE — Patient Instructions (Addendum)
Will obtain labs today and call you with the results (lp/bmet/hfp)  Your physician recommends that you continue on your current medications as directed. Please refer to the Current Medication list given to you today.  Your physician wants you to follow-up in: 6 months with fasting labs (lp/bmet/hfp)  You will receive a reminder letter in the mail two months in advance. If you don't receive a letter, please call our office to schedule the follow-up appointment.  

## 2014-04-14 NOTE — Progress Notes (Signed)
Vincent Perry Date of Birth:  1932-08-17 [place] [address] [place], [place]  [address] [phone]        Fax   [phone]   History of Present Illness:  This pleasant 80 year old gentleman is seen for a scheduled 6 month followup office visit. He has a history of essential hypertension and hypercholesterolemia he has a remote history of prostate cancer 20 years ago. He has a history of PVCs. He has been worrying more about his heart recently after his best friend just died of sudden cardiac death. He has a strong family history of heart disease. His father had hardening of the arteries. He had a sister age 26 who died of a heart attack and a brother age 66 who died of a heart attack. He has not been having any dizzy spells. He said occasional lightheaded spells which she attributes to "hot flashes". This may be referable to his previous prostate cancer treatment. He has had some shortness of breath. He has not been aware of any chest discomfort.  Because of these symptoms the patient underwent a Myoview stress test on 11/01/13 which showed no ischemia and showed an ejection fraction of 63% and no wall motion abnormalities.  Current Outpatient Prescriptions  Medication Sig Dispense Refill  . aspirin 325 MG tablet Take 325 mg by mouth daily. 1/2 Tablet Daily      . doxazosin (CARDURA) 2 MG tablet TAKE 1/2 TABLET DAILY  90 tablet  1  . metoprolol (LOPRESSOR) 100 MG tablet TAKE 1/2 TABLET BY MOUTH EVERY DAY  30 tablet  2   No current facility-administered medications for this visit.    No Known Allergies  Patient Active Problem List   Diagnosis Date Noted  . PVC's (premature ventricular contractions) 10/05/2013  . Benign hypertensive heart disease without heart failure 12/23/2011  . Pure hypercholesterolemia 12/23/2011  . Prostate cancer 12/23/2011    History  Smoking status  . Never Smoker   Smokeless tobacco  . Not on file    History  Alcohol Use  No    Family History  Problem Relation Age of Onset  . Heart disease Father   . Heart attack Father   . Diabetes Mother   . Hypertension Sister   . Heart attack Sister   . Cancer Brother     Review of Systems: Constitutional: no fever chills diaphoresis or fatigue or change in weight.  Head and neck: no hearing loss, no epistaxis, no photophobia or visual disturbance. Respiratory: No cough, shortness of breath or wheezing. Cardiovascular: No chest pain peripheral edema, palpitations. Gastrointestinal: No abdominal distention, no abdominal pain, no change in bowel habits hematochezia or melena. Genitourinary: No dysuria, no frequency, no urgency, no nocturia. Musculoskeletal:No arthralgias, no back pain, no gait disturbance or myalgias. Neurological: No dizziness, no headaches, no numbness, no seizures, no syncope, no weakness, no tremors. Hematologic: No lymphadenopathy, no easy bruising. Psychiatric: No confusion, no hallucinations, no sleep disturbance.    Physical Exam: Filed Vitals:   04/14/14 1132  BP: 134/78  Pulse: 72   the general appearance reveals a well-developed well-nourished elderly gentleman in no distress.The head and neck exam reveals pupils equal and reactive.  Extraocular movements are full.  There is no scleral icterus.  The mouth and pharynx are normal.  The neck is supple.  The carotids reveal no bruits.  The jugular venous pressure is normal.  The  thyroid is not enlarged.  There is no lymphadenopathy.  The chest is clear to percussion and auscultation.  There are no rales or rhonchi.  Expansion of the chest is symmetrical.  The precordium is quiet.  The first heart sound is normal.  The second heart sound is physiologically split.  There is no murmur gallop rub or click.  There is no abnormal lift or heave.  The abdomen is soft and nontender.  The bowel sounds are normal.  The liver and spleen are not enlarged.  There are no abdominal masses.  There are no  abdominal bruits.  Extremities reveal good pedal pulses.  There is no phlebitis or edema.  There is no cyanosis or clubbing.  Strength is normal and symmetrical in all extremities.  There is no lateralizing weakness.  There are no sensory deficits.  The skin is warm and dry.  There is no rash.    Assessment / Plan: 1. essential hypertension without heart failure 2. Hypercholesterolemia 3. history of prostate cancer  Plan: Continue same medication.  Recheck in 6 months for office visit lipid panel hepatic function panel and basal metabolic panel

## 2014-04-14 NOTE — Assessment & Plan Note (Signed)
The patient has hypercholesterolemia.  We're checking fasting lab work today.  He is not requiring any statin therapy at this time and he is working on his diet.  His weight is up 2 pounds since last visit

## 2014-04-14 NOTE — Assessment & Plan Note (Signed)
The patient has not been experiencing any increased dyspnea.  No recent chest pain.  He has occasional left arm weakness.  He had a normal nuclear stress test in December 2014

## 2014-04-16 NOTE — Progress Notes (Signed)
Quick Note:  Please report to patient. The recent labs are stable. Continue same medication and careful diet. ______ 

## 2014-04-19 ENCOUNTER — Telehealth: Payer: Self-pay | Admitting: *Deleted

## 2014-04-19 NOTE — Telephone Encounter (Signed)
Message copied by Earvin Hansen on Wed Apr 19, 2014 12:14 PM ------      Message from: Darlin Coco      Created: Sun Apr 16, 2014  7:39 PM       Please report to patient.  The recent labs are stable. Continue same medication and careful diet. ------

## 2014-04-19 NOTE — Telephone Encounter (Signed)
Advised patient of lab results  

## 2014-08-31 DIAGNOSIS — R972 Elevated prostate specific antigen [PSA]: Secondary | ICD-10-CM | POA: Diagnosis not present

## 2014-09-06 DIAGNOSIS — R972 Elevated prostate specific antigen [PSA]: Secondary | ICD-10-CM | POA: Diagnosis not present

## 2014-09-06 DIAGNOSIS — C61 Malignant neoplasm of prostate: Secondary | ICD-10-CM | POA: Diagnosis not present

## 2014-09-06 DIAGNOSIS — E291 Testicular hypofunction: Secondary | ICD-10-CM | POA: Diagnosis not present

## 2014-09-06 DIAGNOSIS — N3941 Urge incontinence: Secondary | ICD-10-CM | POA: Diagnosis not present

## 2014-10-10 ENCOUNTER — Encounter: Payer: Self-pay | Admitting: Cardiology

## 2014-12-04 ENCOUNTER — Other Ambulatory Visit: Payer: Self-pay | Admitting: Cardiology

## 2014-12-07 ENCOUNTER — Ambulatory Visit (INDEPENDENT_AMBULATORY_CARE_PROVIDER_SITE_OTHER): Payer: Medicare Other | Admitting: Cardiology

## 2014-12-07 ENCOUNTER — Other Ambulatory Visit (INDEPENDENT_AMBULATORY_CARE_PROVIDER_SITE_OTHER): Payer: Medicare Other | Admitting: *Deleted

## 2014-12-07 ENCOUNTER — Encounter: Payer: Self-pay | Admitting: Cardiology

## 2014-12-07 VITALS — BP 158/78 | HR 94 | Ht 68.0 in | Wt 199.2 lb

## 2014-12-07 DIAGNOSIS — K921 Melena: Secondary | ICD-10-CM | POA: Diagnosis not present

## 2014-12-07 DIAGNOSIS — E7800 Pure hypercholesterolemia, unspecified: Secondary | ICD-10-CM

## 2014-12-07 DIAGNOSIS — I119 Hypertensive heart disease without heart failure: Secondary | ICD-10-CM

## 2014-12-07 LAB — BASIC METABOLIC PANEL
BUN: 14 mg/dL (ref 6–23)
CALCIUM: 9.3 mg/dL (ref 8.4–10.5)
CO2: 28 mEq/L (ref 19–32)
Chloride: 106 mEq/L (ref 96–112)
Creatinine, Ser: 1.05 mg/dL (ref 0.40–1.50)
GFR: 72.04 mL/min (ref >60.00)
GLUCOSE: 106 mg/dL — AB (ref 70–99)
Potassium: 4.6 mEq/L (ref 3.5–5.1)
SODIUM: 141 meq/L (ref 135–145)

## 2014-12-07 LAB — HEPATIC FUNCTION PANEL
ALT: 10 U/L (ref 0–53)
AST: 13 U/L (ref 0–37)
Albumin: 3.7 g/dL (ref 3.5–5.2)
Alkaline Phosphatase: 100 U/L (ref 39–117)
Bilirubin, Direct: 0.1 mg/dL (ref 0.0–0.3)
Total Bilirubin: 0.4 mg/dL (ref 0.2–1.2)
Total Protein: 6.6 g/dL (ref 6.0–8.3)

## 2014-12-07 LAB — CBC WITH DIFFERENTIAL/PLATELET
BASOS PCT: 0.7 % (ref 0.0–3.0)
Basophils Absolute: 0.1 10*3/uL (ref 0.0–0.1)
EOS ABS: 0.2 10*3/uL (ref 0.0–0.7)
EOS PCT: 1.9 % (ref 0.0–5.0)
HCT: 38.5 % — ABNORMAL LOW (ref 39.0–52.0)
HEMOGLOBIN: 13 g/dL (ref 13.0–17.0)
LYMPHS ABS: 1.9 10*3/uL (ref 0.7–4.0)
LYMPHS PCT: 20.8 % (ref 12.0–46.0)
MCHC: 33.8 g/dL (ref 30.0–36.0)
MCV: 85.5 fl (ref 78.0–100.0)
MONOS PCT: 7.5 % (ref 3.0–12.0)
Monocytes Absolute: 0.7 10*3/uL (ref 0.1–1.0)
Neutro Abs: 6.4 10*3/uL (ref 1.4–7.7)
Neutrophils Relative %: 69.1 % (ref 43.0–77.0)
PLATELETS: 179 10*3/uL (ref 150.0–400.0)
RBC: 4.51 Mil/uL (ref 4.22–5.81)
RDW: 13.5 % (ref 11.5–15.5)
WBC: 9.3 10*3/uL (ref 4.0–10.5)

## 2014-12-07 LAB — LIPID PANEL
Cholesterol: 146 mg/dL (ref 0–200)
HDL: 32.5 mg/dL — AB (ref >39.00)
NonHDL: 113.5
Total CHOL/HDL Ratio: 4
Triglycerides: 268 mg/dL — ABNORMAL HIGH (ref 0.0–149.0)
VLDL: 53.6 mg/dL — AB (ref 0.0–40.0)

## 2014-12-07 LAB — LDL CHOLESTEROL, DIRECT: Direct LDL: 77 mg/dL

## 2014-12-07 NOTE — Progress Notes (Signed)
Cardiology Office Note   Date:  12/07/2014   ID:  Vincent Perry, DOB Jan 17, 1932, MRN [id number]  PCP:  No primary care provider on file.  Cardiologist:   Darlin Coco, MD   No chief complaint on file.     History of Present Illness: Vincent Perry is a 81 y.o. male who presents for routine follow-up office visit  This pleasant 81 year old gentleman is seen for a scheduled 6 month followup office visit. He has a history of essential hypertension and hypercholesterolemia he has a remote history of prostate cancer 20 years ago. He has a history of PVCs. He has been worrying more about his heart recently after his best friend just died of sudden cardiac death. He has a strong family history of heart disease. His father had hardening of the arteries. He had a sister age 13 who died of a heart attack and a brother age 98 who died of a heart attack. He has not been having any dizzy spells. He said occasional lightheaded spells which she attributes to "hot flashes". This may be referable to his previous prostate cancer treatment. He has had some shortness of breath. He has not been aware of any chest discomfort. Because of these symptoms the patient underwent a Myoview stress test on 11/01/13 which showed no ischemia and showed an ejection fraction of 63% and no wall motion abnormalities.   Past Medical History  Diagnosis Date  . Paroxysmal atrial tachycardia   . Hypertension   . Adenocarcinoma of prostate     with proximal urethral stricture, and reactivation of disease    . Bladder neck contracture     Dense bladder neck contracture and urethral submeatal stricture   . Hypercholesterolemia     Past Surgical History  Procedure Laterality Date  . Cystourethroscopy  03/18/2007    balloon dilation of proximal urethral stricture, cystoscopy, and transurethral incision of proximal urethral stricture, bladder neck contracture, with urethral biopsy  --  SURGEON:  Carolan Clines, M.D.  Marland Kitchen  Cystourethroscopy  09/30/2004    Retrograde urethrogram -- Urethral submeatal dilation. -- Balloon dilation of dense bladder neck contracture -- Collins knife incision of bladder neck contracture. -- SURGEON:  Sigmund I. Gaynelle Arabian, M.D     Current Outpatient Prescriptions  Medication Sig Dispense Refill  . doxazosin (CARDURA) 2 MG tablet TAKE 1/2 TABLET DAILY 90 tablet 1  . metoprolol (LOPRESSOR) 100 MG tablet TAKE 1/2 TABLET BY MOUTH EVERY DAY 30 tablet 0   No current facility-administered medications for this visit.    Allergies:   Review of patient's allergies indicates no known allergies.    Social History:  The patient  reports that he has never smoked. He does not have any smokeless tobacco history on file. He reports that he does not drink alcohol.   Family History:  The patient's family history includes Cancer in his brother; Diabetes in his mother; Heart attack in his father and sister; Heart disease in his father; Hypertension in his sister.    ROS:  Please see the history of present illness.   Otherwise, review of systems are positive for hematochezia.   All other systems are reviewed and negative.    PHYSICAL EXAM: VS:  BP 158/78 mmHg  Pulse 94  Ht 5\' 8"  (1.727 m)  Wt 199 lb 3.2 oz (90.357 kg)  BMI 30.30 kg/m2 , BMI Body mass index is 30.3 kg/(m^2). GEN: Well nourished, well developed, in no acute distress HEENT: normal Neck: no JVD, carotid  bruits, or masses Cardiac: RRR; no murmurs, rubs, or gallops,no edema  Respiratory:  clear to auscultation bilaterally, normal work of breathing GI: soft, nontender, nondistended, + BS MS: no deformity or atrophy Skin: warm and dry, no rash Neuro:  Strength and sensation are intact Psych: euthymic mood, full affect   EKG:  EKG is ordered today. The ekg ordered today demonstrates Normal.   Recent Labs: 04/14/2014: ALT 14; BUN 14; Creatinine 1.0; Potassium 4.0; Sodium 141    Lipid Panel    Component Value Date/Time    CHOL 163 04/14/2014 1211   TRIG 376.0* 04/14/2014 1211   HDL 29.30* 04/14/2014 1211   CHOLHDL 6 04/14/2014 1211   VLDL 75.2* 04/14/2014 1211   LDLCALC 59 04/14/2014 1211   LDLDIRECT 66.8 10/05/2013 1003      Wt Readings from Last 3 Encounters:  12/07/14 199 lb 3.2 oz (90.357 kg)  04/14/14 200 lb (90.719 kg)  10/31/13 198 lb (89.812 kg)      Other studies Reviewed: Additional studies/ records that were reviewed today include:  Review of the above records demonstrates:    ASSESSMENT AND PLAN:  1. essential hypertension without heart failure 2. Hypercholesterolemia 3. history of prostate cancer 4. Hematochezia.  Remote history of hemorrhoid operation about 30 or 40 years ago. 5.  Remote history of prostate cancer   Current medicines are reviewed at length with the patient today.  The patient does not have concerns regarding medicines.  The following changes have been made:  no change  Labs/ tests ordered today include:  No orders of the defined types were placed in this encounter.     Disposition:   FU with Dr. Mare Ferrari in 6 months OV LP HFP BMET Refer to [facility] for evaluation of hematochezia  Signed, Darlin Coco, MD  12/07/2014 8:56 AM    [facility] [place], [place], [place]  [address] Phone: [phone]; Fax: [phone]

## 2014-12-07 NOTE — Patient Instructions (Signed)
Will obtain labs today and call you with the results (lp/bmet/hfp/cbc)  Your physician recommends that you continue on your current medications as directed. Please refer to the Current Medication list given to you today.  Your physician wants you to follow-up in: 6 months with fasting labs (lp/bmet/hfp)  You will receive a reminder letter in the mail two months in advance. If you don't receive a letter, please call our office to schedule the follow-up appointment.   Will arrange for you to see [facility] and call with appointment

## 2014-12-08 ENCOUNTER — Encounter: Payer: Self-pay | Admitting: Gastroenterology

## 2015-01-25 ENCOUNTER — Ambulatory Visit (INDEPENDENT_AMBULATORY_CARE_PROVIDER_SITE_OTHER): Payer: Medicare Other | Admitting: Gastroenterology

## 2015-01-25 ENCOUNTER — Encounter: Payer: Self-pay | Admitting: Gastroenterology

## 2015-01-25 VITALS — BP 144/70 | HR 94 | Ht 68.0 in | Wt 192.4 lb

## 2015-01-25 DIAGNOSIS — K921 Melena: Secondary | ICD-10-CM

## 2015-01-25 NOTE — Patient Instructions (Signed)
You have been scheduled for a colonoscopy. Please follow written instructions given to you at your visit today.  Please pick up your prep supplies at the pharmacy within the next 1-3 days. If you use inhalers (even only as needed), please bring them with you on the day of your procedure.  Thank you for choosing me and [facility].  Pricilla Riffle. Dagoberto Ligas., MD., Marval Regal  cc: Darlin Coco, MD

## 2015-01-25 NOTE — Progress Notes (Signed)
History of Present Illness: This is an 81 year old male referred by Darlin Coco, MD for the evaluation of hematochezia. The patient relates a history of hemorrhoids and a hemorrhoidectomy performed 30 years ago. He notes intermittent bright red blood per rectum on and off for many years. Recently had noted small amounts of bright blood per rectum. He is status post prostatectomy for prostate cancer and relates he had radiation therapy prior to his prostatectomy. He's never had colonoscopy. Denies weight loss, abdominal pain, constipation, diarrhea, change in stool caliber, melena, nausea, vomiting, dysphagia, reflux symptoms, chest pain.  No Known Allergies Outpatient Prescriptions Prior to Visit  Medication Sig Dispense Refill  . doxazosin (CARDURA) 2 MG tablet TAKE 1/2 TABLET DAILY 90 tablet 1  . metoprolol (LOPRESSOR) 100 MG tablet TAKE 1/2 TABLET BY MOUTH EVERY DAY 30 tablet 0   No facility-administered medications prior to visit.   Past Medical History  Diagnosis Date  . Paroxysmal atrial tachycardia   . Hypertension   . Adenocarcinoma of prostate     with proximal urethral stricture, and reactivation of disease    . Bladder neck contracture     Dense bladder neck contracture and urethral submeatal stricture   . Hypercholesterolemia   . PVC (premature ventricular contraction)    Past Surgical History  Procedure Laterality Date  . Cystourethroscopy  03/18/2007    balloon dilation of proximal urethral stricture, cystoscopy, and transurethral incision of proximal urethral stricture, bladder neck contracture, with urethral biopsy  --  SURGEON:  Carolan Clines, M.D.  Marland Kitchen Cystourethroscopy  09/30/2004    Retrograde urethrogram -- Urethral submeatal dilation. -- Balloon dilation of dense bladder neck contracture -- Collins knife incision of bladder neck contracture. -- SURGEON:  Sigmund I. Gaynelle Arabian, M.D  . Prostatectomy    . Hemorrhoid surgery     History   Social  History  . Marital Status: Widowed    Spouse Name: N/A  . Number of Children: 1  . Years of Education: N/A   Occupational History  . electric shop , motor repair    Social History Main Topics  . Smoking status: Never Smoker   . Smokeless tobacco: Never Used  . Alcohol Use: No  . Drug Use: No  . Sexual Activity: Not on file   Other Topics Concern  . None   Social History Narrative   Family History  Problem Relation Age of Onset  . Heart disease Father   . Heart attack Father   . Diabetes Mother   . Hypertension Sister   . Heart attack Sister   . Prostate cancer Brother      Review of Systems: Pertinent positive and negative review of systems were noted in the above HPI section. All other review of systems were otherwise negative.  Physical Exam: General: Well developed, well nourished, no acute distress Head: Normocephalic and atraumatic Eyes:  sclerae anicteric, EOMI Ears: Normal auditory acuity Mouth: No deformity or lesions Neck: Supple, no masses or thyromegaly Lungs: Clear throughout to auscultation Heart: Regular rate and rhythm; no murmurs, rubs or bruits Abdomen: Soft, non tender and non distended. No masses, hepatosplenomegaly or hernias noted. Normal Bowel sounds Rectal: deferred to colonoscopy Musculoskeletal: Symmetrical with no gross deformities  Skin: No lesions on visible extremities Pulses:  Normal pulses noted Extremities: No clubbing, cyanosis, edema or deformities noted Neurological: Alert oriented x 4, grossly nonfocal Cervical Nodes:  No significant cervical adenopathy Inguinal Nodes: No significant inguinal adenopathy Psychological:  Alert and cooperative.  Normal mood and affect  Assessment and Recommendations:  1. Hematochezia. R/O hemorrhoids, neoplasms, radiation proctitis. The risks (including bleeding, perforation, infection, missed lesions, medication reactions and possible hospitalization or surgery if complications occur), benefits,  and alternatives to colonoscopy with possible biopsy. Possible injection of hemorrhoids and possible polypectomy were discussed with the patient and they consent to proceed.    cc: Darlin Coco, MD [address] [place],  [id number]

## 2015-01-30 ENCOUNTER — Other Ambulatory Visit: Payer: Self-pay | Admitting: Cardiology

## 2015-01-30 ENCOUNTER — Ambulatory Visit (AMBULATORY_SURGERY_CENTER): Payer: Medicare Other | Admitting: Gastroenterology

## 2015-01-30 ENCOUNTER — Encounter: Payer: Self-pay | Admitting: Gastroenterology

## 2015-01-30 VITALS — BP 160/84 | HR 68 | Temp 98.7°F | Resp 23 | Ht 68.0 in | Wt 192.0 lb

## 2015-01-30 DIAGNOSIS — D120 Benign neoplasm of cecum: Secondary | ICD-10-CM

## 2015-01-30 DIAGNOSIS — D123 Benign neoplasm of transverse colon: Secondary | ICD-10-CM | POA: Diagnosis not present

## 2015-01-30 DIAGNOSIS — K921 Melena: Secondary | ICD-10-CM | POA: Diagnosis not present

## 2015-01-30 DIAGNOSIS — D125 Benign neoplasm of sigmoid colon: Secondary | ICD-10-CM | POA: Diagnosis not present

## 2015-01-30 DIAGNOSIS — D124 Benign neoplasm of descending colon: Secondary | ICD-10-CM | POA: Diagnosis not present

## 2015-01-30 DIAGNOSIS — I471 Supraventricular tachycardia: Secondary | ICD-10-CM | POA: Diagnosis not present

## 2015-01-30 DIAGNOSIS — I10 Essential (primary) hypertension: Secondary | ICD-10-CM | POA: Diagnosis not present

## 2015-01-30 MED ORDER — SODIUM CHLORIDE 0.9 % IV SOLN: 500.0000 mL | INTRAVENOUS | Status: DC

## 2015-01-30 NOTE — Op Note (Signed)
[place]  [address] [place], [id number]   COLONOSCOPY PROCEDURE REPORT  PATIENT: Vincent Perry, Vincent Perry  MR#: [id number] BIRTHDATE: 27-Jun-1932 , 81  yrs. old GENDER: male ENDOSCOPIST: Ladene Artist, MD, [facility] REFERRED IO:[id number] Mare Ferrari, M.D. PROCEDURE DATE:  01/30/2015 PROCEDURE:   Colonoscopy, diagnostic, Colonoscopy with biopsy, and Colonoscopy with snare polypectomy First Screening Colonoscopy - Avg.  risk and is 50 yrs.  old or older - No.  Prior Negative Screening - Now for repeat screening. N/A  History of Adenoma - Now for follow-up colonoscopy & has been > or = to 3 yrs.  N/A ASA CLASS:   Class III INDICATIONS:Evaluation of unexplained GI bleeding, Colorectal Neoplasm Risk Assessment for this procedure is average risk, and hematochezia. MEDICATIONS: Monitored anesthesia care and Propofol 200 mg IV DESCRIPTION OF PROCEDURE:   After the risks benefits and alternatives of the procedure were thoroughly explained, informed consent was obtained.  The digital rectal exam revealed no abnormalities of the rectum.   The [id number]  endoscope was introduced through the anus and advanced to the cecum, which was identified by both the appendix and ileocecal valve. No adverse events experienced.   The quality of the prep was good.  (MiraLax was used)  The instrument was then slowly withdrawn as the colon was fully examined.    COLON FINDINGS: A sessile polyp measuring 6 mm in size was found at the cecum.  A polypectomy was performed with a cold snare.  The resection was complete, the polyp tissue was completely retrieved and sent to histology. Four sessile polyps ranging between 5-37mm in size were found in the transverse colon.  Polypectomies were performed with a cold snare.  The resection was complete, the polyp tissue was completely retrieved and sent to histology. A sessile polyp measuring 6 mm in size was found in the descending colon.  A polypectomy  was performed with a cold snare.  The resection was complete, the polyp tissue was completely retrieved and sent to histology. Two sessile polyps measuring 4 mm in size were found in the sigmoid colon and transverse colon.  Polypectomies were performed with cold forceps.  The resection was complete, the polyp tissue was completely retrieved and sent to histology. There was moderate diverticulosis noted in the sigmoid colon with associated colonic spasm and muscular hypertrophy. The examination was otherwise normal.  Retroflexed views revealed internal Grade I hemorrhoids. The time to cecum = 2.4 Withdrawal time = 14.4   The scope was withdrawn and the procedure completed. COMPLICATIONS: There were no immediate complications.   ENDOSCOPIC IMPRESSION: 1.   Sessile polyp at the cecum; polypectomy performed with a cold snare 2.   Four sessile polyps 5-80mm found in the transverse colon; polypectomies performed with a cold snare 3.   Sessile polyp in the descending colon; polypectomy performed with a cold snare 4.   Two sessile polyps in the sigmoid colon and transverse colon; polypectomies performed with cold forceps 5.   Moderate diverticulosis noted in the sigmoid colon 6.   Grade l internal hemorrhoids  RECOMMENDATIONS: 1.  Hold Aspirin and all other NSAIDS for 2 weeks. 2.  Await biopsy results 3.   Consider repeat colonoscopy in 3 years if 3 or more polyps adenomatous; otherwise no plans for future screening or surveillance colonoscopies due to age 81.  Prep H bid as needed 5.  High fiber diet with liberal fluid intake.  eSigned:  Ladene Artist, MD, [facility] 01/30/2015 12:02 PM  PATIENT NAME:  Vincent Perry, Vincent Perry MR#: [id number]

## 2015-01-30 NOTE — Progress Notes (Signed)
To recovery, report to [facility],RN. VSS 

## 2015-01-30 NOTE — Patient Instructions (Signed)
YOU HAD AN ENDOSCOPIC PROCEDURE TODAY AT [facility]:   Refer to the procedure report that was given to you for any specific questions about what was found during the examination.  If the procedure report does not answer your questions, please call your gastroenterologist to clarify.  If you requested that your care partner not be given the details of your procedure findings, then the procedure report has been included in a sealed envelope for you to review at your convenience later.  YOU SHOULD EXPECT: Some feelings of bloating in the abdomen. Passage of more gas than usual.  Walking can help get rid of the air that was put into your GI tract during the procedure and reduce the bloating. If you had a lower endoscopy (such as a colonoscopy or flexible sigmoidoscopy) you may notice spotting of blood in your stool or on the toilet paper. If you underwent a bowel prep for your procedure, you may not have a normal bowel movement for a few days.  Please Note:  You might notice some irritation and congestion in your nose or some drainage.  This is from the oxygen used during your procedure.  There is no need for concern and it should clear up in a day or so.  SYMPTOMS TO REPORT IMMEDIATELY:   Following lower endoscopy (colonoscopy or flexible sigmoidoscopy):  Excessive amounts of blood in the stool  Significant tenderness or worsening of abdominal pains  Swelling of the abdomen that is new, acute  Fever of 100F or higher    For urgent or emergent issues, a gastroenterologist can be reached at any hour by calling [phone].   DIET: Your first meal following the procedure should be a small meal and then it is ok to progress to your normal diet. Heavy or fried foods are harder to digest and may make you feel nauseous or bloated.  Likewise, meals heavy in dairy and vegetables can increase bloating.  Drink plenty of fluids but you should avoid alcoholic beverages for 24  hours.  ACTIVITY:  You should plan to take it easy for the rest of today and you should NOT DRIVE or use heavy machinery until tomorrow (because of the sedation medicines used during the test).    FOLLOW UP: Our staff will call the number listed on your records the next business day following your procedure to check on you and address any questions or concerns that you may have regarding the information given to you following your procedure. If we do not reach you, we will leave a message.  However, if you are feeling well and you are not experiencing any problems, there is no need to return our call.  We will assume that you have returned to your regular daily activities without incident.  If any biopsies were taken you will be contacted by phone or by letter within the next 1-3 weeks.  Please call us at [phone] if you have not heard about the biopsies in 3 weeks.    SIGNATURES/CONFIDENTIALITY: You and/or your care partner have signed paperwork which will be entered into your electronic medical record.  These signatures attest to the fact that that the information above on your After Visit Summary has been reviewed and is understood.  Full responsibility of the confidentiality of this discharge information lies with you and/or your care-partner.  Polyp, diverticulosis, high fiber diet and hemorrhoid information given. Liberal fluid intake,. Hold aspirin and all other NSAIDS for two weeks. Dr. Fuller Plan  will advise you about followup colonoscopy. Preparaton H twice a day as needed.

## 2015-01-31 ENCOUNTER — Telehealth: Payer: Self-pay | Admitting: *Deleted

## 2015-01-31 NOTE — Telephone Encounter (Signed)
  Follow up Call-  Call back number 01/30/2015  Post procedure Call Back phone  # [phone]  Permission to leave phone message Yes     Patient questions:  Do you have a fever, pain , or abdominal swelling? No. Pain Score  0 *  Have you tolerated food without any problems? Yes.    Have you been able to return to your normal activities? Yes.    Do you have any questions about your discharge instructions: Diet   No. Medications  No. Follow up visit  No.  Do you have questions or concerns about your Care? No.  Actions: * If pain score is 4 or above: No action needed, pain <4.

## 2015-02-08 ENCOUNTER — Encounter: Payer: Self-pay | Admitting: Gastroenterology

## 2015-04-11 ENCOUNTER — Other Ambulatory Visit: Payer: Self-pay | Admitting: Cardiology

## 2015-07-14 DIAGNOSIS — Z205 Contact with and (suspected) exposure to viral hepatitis: Secondary | ICD-10-CM | POA: Diagnosis not present

## 2015-07-14 DIAGNOSIS — Z7251 High risk heterosexual behavior: Secondary | ICD-10-CM | POA: Diagnosis not present

## 2015-07-14 DIAGNOSIS — Z114 Encounter for screening for human immunodeficiency virus [HIV]: Secondary | ICD-10-CM | POA: Diagnosis not present

## 2015-07-14 DIAGNOSIS — Z0189 Encounter for other specified special examinations: Secondary | ICD-10-CM | POA: Diagnosis not present

## 2015-07-14 DIAGNOSIS — R5383 Other fatigue: Secondary | ICD-10-CM | POA: Diagnosis not present

## 2015-07-25 ENCOUNTER — Other Ambulatory Visit: Payer: Self-pay | Admitting: Cardiology

## 2015-09-17 ENCOUNTER — Telehealth: Payer: Self-pay | Admitting: Cardiology

## 2015-09-17 NOTE — Telephone Encounter (Signed)
Received call from patient.He stated he woke up around 6:00 am this morning with chest pain.Stated he drank a glass of hot water and pain went away.Stated he has felt good today no chest pain.Stated he ate 2 bologna sandwiches 1 hour before bed and thought probably indigestion.Stated he wanted to let Dr.Brackbill know.After reviewing chart patient missed his 6 month follow up.Advised I will send message to Dr.Brackbill.Also I will send message to [facility] to schedule appointment with Dr.Brackbill.Advised if he has any more chest pain go to ER.

## 2015-09-17 NOTE — Telephone Encounter (Signed)
Scheduled appointment for Thursday

## 2015-09-17 NOTE — Telephone Encounter (Signed)
New message     Pt c/o of Chest Pain: STAT if CP now or developed within 24 hours  1. Are you having CP right now?  No---- 2. Are you experiencing any other symptoms (ex. SOB, nausea, vomiting, sweating)? no  3. How long have you been experiencing CP?  Pt had chest pain this am around 6am for 22min-1hr 4. Is your CP continuous or coming and going? cotinuous 5. Have you taken Nitroglycerin? no ?

## 2015-09-18 NOTE — Telephone Encounter (Signed)
Agree with advice given

## 2015-09-19 DIAGNOSIS — C61 Malignant neoplasm of prostate: Secondary | ICD-10-CM | POA: Diagnosis not present

## 2015-09-20 ENCOUNTER — Ambulatory Visit (INDEPENDENT_AMBULATORY_CARE_PROVIDER_SITE_OTHER): Payer: Medicare Other | Admitting: Cardiology

## 2015-09-20 VITALS — BP 140/70 | HR 84 | Ht 70.0 in | Wt 187.0 lb

## 2015-09-20 DIAGNOSIS — I119 Hypertensive heart disease without heart failure: Secondary | ICD-10-CM | POA: Diagnosis not present

## 2015-09-20 DIAGNOSIS — R079 Chest pain, unspecified: Secondary | ICD-10-CM | POA: Diagnosis not present

## 2015-09-20 DIAGNOSIS — C61 Malignant neoplasm of prostate: Secondary | ICD-10-CM

## 2015-09-20 DIAGNOSIS — E7800 Pure hypercholesterolemia, unspecified: Secondary | ICD-10-CM | POA: Diagnosis not present

## 2015-09-20 NOTE — Patient Instructions (Signed)
Medication Instructions:  RESUME YOUR LOPRESSOR (METOPROLOL)   STOP CARDURA   Labwork: NONE  Testing/Procedures: NONE  Follow-Up: Your physician wants you to follow-up in: 1 [place] will receive a reminder letter in the mail two months in advance. If you don't receive a letter, please call our office to schedule the follow-up appointment.  If you need a refill on your cardiac medications before your next appointment, please call your pharmacy.

## 2015-09-20 NOTE — Progress Notes (Signed)
Cardiology Office Note   Date:  09/20/2015   ID:  Vincent Perry, DOB March 03, 1932, MRN [id number]  PCP:  No primary care provider on file.  Cardiologist: Darlin Coco MD  Chief Complaint  Patient presents with  . Chest Pain      History of Present Illness: Vincent Perry is a 81 y.o. male who presents for a scheduled follow-up visit.  This pleasant 81 year old gentleman is seen for a scheduled 6 month followup office visit. He has a history of essential hypertension and hypercholesterolemia and he has a remote history of prostate cancer 20 years ago. He has a history of PVCs.  He has a strong family history of heart disease. His father had hardening of the arteries. He had a sister age 78 who died of a heart attack and a brother age 21 who died of a heart attack. He has not been having any dizzy spells. He said occasional lightheaded spells which she attributes to "hot flashes". This may be referable to his previous prostate cancer treatment. He has had some shortness of breath. He has not been aware of any chest discomfort. Because of these symptoms the patient underwent a Myoview stress test on 11/01/13 which showed no ischemia and showed an ejection fraction of 63% and no wall motion abnormalities. Since last visit he has had no new cardiac symptoms.  He has been traveling for the past few weeks and has not been taking any of his blood pressure medicines.  He has temporarily lost the bottles somewhere in his car. Last weekend he ate 2 baloney sandwiches on Sunday night and early Monday morning awoke with chest pain.  He drank some hot water and the pain resolved and he was able to go back to sleep promptly.  The symptoms have not recurred.  Past Medical History  Diagnosis Date  . Paroxysmal atrial tachycardia   . Hypertension   . Adenocarcinoma of prostate     with proximal urethral stricture, and reactivation of disease    . Bladder neck contracture     Dense bladder neck  contracture and urethral submeatal stricture   . Hypercholesterolemia   . PVC (premature ventricular contraction)     Past Surgical History  Procedure Laterality Date  . Cystourethroscopy  03/18/2007    balloon dilation of proximal urethral stricture, cystoscopy, and transurethral incision of proximal urethral stricture, bladder neck contracture, with urethral biopsy  --  SURGEON:  Carolan Clines, M.D.  Marland Kitchen Cystourethroscopy  09/30/2004    Retrograde urethrogram -- Urethral submeatal dilation. -- Balloon dilation of dense bladder neck contracture -- Collins knife incision of bladder neck contracture. -- SURGEON:  Sigmund I. Gaynelle Arabian, M.D  . Prostatectomy    . Hemorrhoid surgery       Current Outpatient Prescriptions  Medication Sig Dispense Refill  . aspirin 325 MG tablet Take 325 mg by mouth.     . metoprolol (LOPRESSOR) 100 MG tablet TAKE 1/2 TABLET BY MOUTH EVERY DAY 15 tablet 3   No current facility-administered medications for this visit.    Allergies:   Review of patient's allergies indicates no known allergies.    Social History:  The patient  reports that he has never smoked. He has never used smokeless tobacco. He reports that he does not drink alcohol or use illicit drugs.   Family History:  The patient's family history includes Diabetes in his mother; Heart attack in his father and sister; Heart disease in his father; Hypertension in his  sister; Prostate cancer in his brother.    ROS:  Please see the history of present illness.   Otherwise, review of systems are positive for none.   All other systems are reviewed and negative.    PHYSICAL EXAM: VS:  BP 140/70 mmHg  Pulse 84  Ht 5\' 10"  (1.778 m)  Wt 187 lb (84.823 kg)  BMI 26.83 kg/m2 , BMI Body mass index is 26.83 kg/(m^2). GEN: Well nourished, well developed, in no acute distress HEENT: normal Neck: no JVD, carotid bruits, or masses Cardiac: RRR; no murmurs, rubs, or gallops,no edema  Respiratory:  clear  to auscultation bilaterally, normal work of breathing GI: soft, nontender, nondistended, + BS MS: no deformity or atrophy Skin: warm and dry, no rash Neuro:  Strength and sensation are intact Psych: euthymic mood, full affect   EKG:  EKG is ordered today. The ekg ordered today demonstrates normal sinus rhythm.  Nonspecific T-wave abnormality.   Recent Labs: 12/07/2014: ALT 10; BUN 14; Creatinine, Ser 1.05; Hemoglobin 13.0; Platelets 179.0; Potassium 4.6; Sodium 141    Lipid Panel    Component Value Date/Time   CHOL 146 12/07/2014 0934   TRIG 268.0* 12/07/2014 0934   HDL 32.50* 12/07/2014 0934   CHOLHDL 4 12/07/2014 0934   VLDL 53.6* 12/07/2014 0934   LDLCALC 59 04/14/2014 1211   LDLDIRECT 77.0 12/07/2014 0934      Wt Readings from Last 3 Encounters:  09/20/15 187 lb (84.823 kg)  01/30/15 192 lb (87.091 kg)  01/25/15 192 lb 6.4 oz (87.272 kg)         ASSESSMENT AND PLAN:  1. essential hypertension without heart failure 2. Hypercholesterolemia 3. history of prostate cancer 24 years ago.  Now followed by Dr. Gaynelle Arabian.  No recurrence    Current medicines are reviewed at length with the patient today.  The patient does not have concerns regarding medicines.  The following changes have been made:  no change  Labs/ tests ordered today include:   Orders Placed This Encounter  Procedures  . EKG 12-Lead     Disposition: He will resume taking his metoprolol.  For now he will leave off his Cardura unless he develops difficulty with urination.  He will return to our office in one year.  He will be obtaining a primary care physician through the [facility] website.  Berna Spare MD 09/20/2015 1:33 PM    [facility] [place], [place], [place]  [address] Phone: [phone]; Fax: [phone]

## 2015-09-26 DIAGNOSIS — R972 Elevated prostate specific antigen [PSA]: Secondary | ICD-10-CM | POA: Diagnosis not present

## 2015-09-26 DIAGNOSIS — R3915 Urgency of urination: Secondary | ICD-10-CM | POA: Diagnosis not present

## 2015-09-26 DIAGNOSIS — N3941 Urge incontinence: Secondary | ICD-10-CM | POA: Diagnosis not present

## 2015-09-26 DIAGNOSIS — C61 Malignant neoplasm of prostate: Secondary | ICD-10-CM | POA: Diagnosis not present

## 2015-10-30 DIAGNOSIS — L570 Actinic keratosis: Secondary | ICD-10-CM | POA: Diagnosis not present

## 2015-10-30 DIAGNOSIS — L821 Other seborrheic keratosis: Secondary | ICD-10-CM | POA: Diagnosis not present

## 2015-10-30 DIAGNOSIS — L820 Inflamed seborrheic keratosis: Secondary | ICD-10-CM | POA: Diagnosis not present

## 2015-12-04 DIAGNOSIS — L820 Inflamed seborrheic keratosis: Secondary | ICD-10-CM | POA: Diagnosis not present

## 2015-12-04 DIAGNOSIS — L570 Actinic keratosis: Secondary | ICD-10-CM | POA: Diagnosis not present

## 2015-12-07 ENCOUNTER — Other Ambulatory Visit: Payer: Self-pay | Admitting: *Deleted

## 2015-12-07 MED ORDER — METOPROLOL TARTRATE 100 MG PO TABS: 50.0000 mg | ORAL_TABLET | Freq: Every day | ORAL | Status: DC

## 2016-10-13 DIAGNOSIS — I10 Essential (primary) hypertension: Secondary | ICD-10-CM | POA: Diagnosis not present

## 2017-07-14 DIAGNOSIS — Z8546 Personal history of malignant neoplasm of prostate: Secondary | ICD-10-CM | POA: Diagnosis not present

## 2017-07-14 DIAGNOSIS — R3912 Poor urinary stream: Secondary | ICD-10-CM | POA: Diagnosis not present

## 2017-07-20 DIAGNOSIS — C61 Malignant neoplasm of prostate: Secondary | ICD-10-CM | POA: Diagnosis not present

## 2017-09-03 DIAGNOSIS — C61 Malignant neoplasm of prostate: Secondary | ICD-10-CM | POA: Diagnosis not present

## 2017-09-11 ENCOUNTER — Other Ambulatory Visit (HOSPITAL_COMMUNITY): Payer: Self-pay | Admitting: Urology

## 2017-09-11 DIAGNOSIS — C61 Malignant neoplasm of prostate: Secondary | ICD-10-CM

## 2017-09-30 ENCOUNTER — Encounter (HOSPITAL_COMMUNITY)
Admission: RE | Admit: 2017-09-30 | Discharge: 2017-09-30 | Disposition: A | Discharge status: Home / Self Care | Payer: Medicare Other | Source: Ambulatory Visit | Attending: Urology | Admitting: Urology

## 2017-09-30 ENCOUNTER — Ambulatory Visit (HOSPITAL_COMMUNITY)
Admission: RE | Admit: 2017-09-30 | Discharge: 2017-09-30 | Disposition: A | Discharge status: Home / Self Care | Payer: Medicare Other | Source: Ambulatory Visit | Attending: Urology | Admitting: Urology

## 2017-09-30 DIAGNOSIS — R911 Solitary pulmonary nodule: Secondary | ICD-10-CM | POA: Diagnosis not present

## 2017-09-30 DIAGNOSIS — C61 Malignant neoplasm of prostate: Secondary | ICD-10-CM | POA: Insufficient documentation

## 2017-09-30 MED ORDER — TECHNETIUM TC 99M MEDRONATE IV KIT
20.3000 | PACK | Freq: Once | INTRAVENOUS | Status: AC | PRN
  Administered 2017-09-30: 20.3 via INTRAVENOUS

## 2017-10-05 DIAGNOSIS — C61 Malignant neoplasm of prostate: Secondary | ICD-10-CM | POA: Diagnosis not present

## 2017-10-05 DIAGNOSIS — R3912 Poor urinary stream: Secondary | ICD-10-CM | POA: Diagnosis not present

## 2017-10-12 DIAGNOSIS — R918 Other nonspecific abnormal finding of lung field: Secondary | ICD-10-CM | POA: Diagnosis not present

## 2017-10-16 ENCOUNTER — Encounter: Payer: Self-pay | Admitting: Oncology

## 2017-10-22 ENCOUNTER — Other Ambulatory Visit (HOSPITAL_COMMUNITY): Payer: Self-pay | Admitting: Urology

## 2017-10-22 DIAGNOSIS — R918 Other nonspecific abnormal finding of lung field: Secondary | ICD-10-CM

## 2017-10-26 ENCOUNTER — Encounter: Payer: Self-pay | Admitting: *Deleted

## 2017-10-27 ENCOUNTER — Ambulatory Visit: Payer: Medicare Other | Admitting: Oncology

## 2017-10-27 DIAGNOSIS — R0602 Shortness of breath: Secondary | ICD-10-CM | POA: Diagnosis not present

## 2017-10-27 DIAGNOSIS — Z9079 Acquired absence of other genital organ(s): Secondary | ICD-10-CM | POA: Diagnosis not present

## 2017-10-27 DIAGNOSIS — Z8546 Personal history of malignant neoplasm of prostate: Secondary | ICD-10-CM | POA: Diagnosis not present

## 2017-10-27 DIAGNOSIS — R918 Other nonspecific abnormal finding of lung field: Secondary | ICD-10-CM | POA: Diagnosis not present

## 2017-10-27 DIAGNOSIS — Z923 Personal history of irradiation: Secondary | ICD-10-CM | POA: Diagnosis not present

## 2017-10-27 DIAGNOSIS — I10 Essential (primary) hypertension: Secondary | ICD-10-CM | POA: Diagnosis not present

## 2017-10-27 DIAGNOSIS — C61 Malignant neoplasm of prostate: Secondary | ICD-10-CM | POA: Diagnosis not present

## 2017-10-27 DIAGNOSIS — C7802 Secondary malignant neoplasm of left lung: Secondary | ICD-10-CM | POA: Diagnosis not present

## 2017-10-27 DIAGNOSIS — J939 Pneumothorax, unspecified: Secondary | ICD-10-CM | POA: Diagnosis not present

## 2017-10-27 DIAGNOSIS — Z87891 Personal history of nicotine dependence: Secondary | ICD-10-CM | POA: Diagnosis not present

## 2017-10-27 DIAGNOSIS — E785 Hyperlipidemia, unspecified: Secondary | ICD-10-CM | POA: Diagnosis not present

## 2017-10-28 DIAGNOSIS — R918 Other nonspecific abnormal finding of lung field: Secondary | ICD-10-CM | POA: Diagnosis not present

## 2017-10-28 DIAGNOSIS — J95811 Postprocedural pneumothorax: Secondary | ICD-10-CM | POA: Diagnosis not present

## 2017-10-30 DIAGNOSIS — J95811 Postprocedural pneumothorax: Secondary | ICD-10-CM | POA: Diagnosis not present

## 2017-10-30 DIAGNOSIS — R918 Other nonspecific abnormal finding of lung field: Secondary | ICD-10-CM | POA: Diagnosis not present

## 2017-11-13 DIAGNOSIS — Z192 Hormone resistant malignancy status: Secondary | ICD-10-CM | POA: Diagnosis not present

## 2017-11-13 DIAGNOSIS — R972 Elevated prostate specific antigen [PSA]: Secondary | ICD-10-CM | POA: Diagnosis not present

## 2017-11-13 DIAGNOSIS — C61 Malignant neoplasm of prostate: Secondary | ICD-10-CM | POA: Diagnosis not present

## 2017-11-13 DIAGNOSIS — R3915 Urgency of urination: Secondary | ICD-10-CM | POA: Diagnosis not present

## 2017-11-26 DIAGNOSIS — Z79899 Other long term (current) drug therapy: Secondary | ICD-10-CM | POA: Diagnosis not present

## 2017-11-26 DIAGNOSIS — C61 Malignant neoplasm of prostate: Secondary | ICD-10-CM | POA: Diagnosis not present

## 2017-11-26 DIAGNOSIS — C7800 Secondary malignant neoplasm of unspecified lung: Secondary | ICD-10-CM | POA: Diagnosis not present

## 2017-11-26 DIAGNOSIS — E291 Testicular hypofunction: Secondary | ICD-10-CM | POA: Diagnosis not present

## 2017-12-30 DIAGNOSIS — C61 Malignant neoplasm of prostate: Secondary | ICD-10-CM | POA: Diagnosis not present

## 2017-12-30 DIAGNOSIS — C7800 Secondary malignant neoplasm of unspecified lung: Secondary | ICD-10-CM | POA: Diagnosis not present

## 2017-12-30 DIAGNOSIS — E291 Testicular hypofunction: Secondary | ICD-10-CM | POA: Diagnosis not present

## 2017-12-30 DIAGNOSIS — Z79899 Other long term (current) drug therapy: Secondary | ICD-10-CM | POA: Diagnosis not present

## 2018-01-08 ENCOUNTER — Encounter: Payer: Self-pay | Admitting: Gastroenterology

## 2018-01-27 DIAGNOSIS — Z9079 Acquired absence of other genital organ(s): Secondary | ICD-10-CM | POA: Diagnosis not present

## 2018-01-27 DIAGNOSIS — C61 Malignant neoplasm of prostate: Secondary | ICD-10-CM | POA: Diagnosis not present

## 2018-01-27 DIAGNOSIS — C7800 Secondary malignant neoplasm of unspecified lung: Secondary | ICD-10-CM | POA: Diagnosis not present

## 2018-01-27 DIAGNOSIS — Z79818 Long term (current) use of other agents affecting estrogen receptors and estrogen levels: Secondary | ICD-10-CM | POA: Diagnosis not present

## 2018-01-27 DIAGNOSIS — Z8042 Family history of malignant neoplasm of prostate: Secondary | ICD-10-CM | POA: Diagnosis not present

## 2018-01-27 DIAGNOSIS — Z87891 Personal history of nicotine dependence: Secondary | ICD-10-CM | POA: Diagnosis not present

## 2018-02-25 DIAGNOSIS — I714 Abdominal aortic aneurysm, without rupture: Secondary | ICD-10-CM | POA: Diagnosis not present

## 2018-02-25 DIAGNOSIS — C7800 Secondary malignant neoplasm of unspecified lung: Secondary | ICD-10-CM | POA: Diagnosis not present

## 2018-02-25 DIAGNOSIS — E291 Testicular hypofunction: Secondary | ICD-10-CM | POA: Diagnosis not present

## 2018-02-25 DIAGNOSIS — C61 Malignant neoplasm of prostate: Secondary | ICD-10-CM | POA: Diagnosis not present

## 2018-02-25 DIAGNOSIS — Z87891 Personal history of nicotine dependence: Secondary | ICD-10-CM | POA: Diagnosis not present

## 2018-02-25 DIAGNOSIS — R918 Other nonspecific abnormal finding of lung field: Secondary | ICD-10-CM | POA: Diagnosis not present

## 2018-02-28 DIAGNOSIS — C61 Malignant neoplasm of prostate: Secondary | ICD-10-CM | POA: Diagnosis not present

## 2018-03-22 DIAGNOSIS — C61 Malignant neoplasm of prostate: Secondary | ICD-10-CM | POA: Diagnosis not present

## 2018-05-27 DIAGNOSIS — C7800 Secondary malignant neoplasm of unspecified lung: Secondary | ICD-10-CM | POA: Diagnosis not present

## 2018-05-27 DIAGNOSIS — C61 Malignant neoplasm of prostate: Secondary | ICD-10-CM | POA: Diagnosis not present

## 2018-05-27 DIAGNOSIS — Z79899 Other long term (current) drug therapy: Secondary | ICD-10-CM | POA: Diagnosis not present

## 2018-07-30 DIAGNOSIS — C7800 Secondary malignant neoplasm of unspecified lung: Secondary | ICD-10-CM | POA: Diagnosis not present

## 2018-07-30 DIAGNOSIS — C61 Malignant neoplasm of prostate: Secondary | ICD-10-CM | POA: Diagnosis not present

## 2018-08-26 DIAGNOSIS — C7800 Secondary malignant neoplasm of unspecified lung: Secondary | ICD-10-CM | POA: Diagnosis not present

## 2018-08-26 DIAGNOSIS — C61 Malignant neoplasm of prostate: Secondary | ICD-10-CM | POA: Diagnosis not present

## 2018-08-26 DIAGNOSIS — Z79899 Other long term (current) drug therapy: Secondary | ICD-10-CM | POA: Diagnosis not present

## 2018-08-31 NOTE — Progress Notes (Signed)
Cardiology Office Note:    Date:  09/01/2018   ID:  Vincent Perry, DOB 10-29-1932, MRN [id number]  PCP:  Patient, No Pcp Per  Cardiologist:  No primary care provider on file.   Referring MD: No ref. provider found   Chief Complaint  Patient presents with  . Hypertension    History of Present Illness:    Vincent Perry is a 84 y.o. male with a hx of essential hypertension and hypercholesterolemia and he has a remote history of prostate cancer 20 years ago. He has a history of PVCs.   Looking to reestablish with a cardiologist.  Says he did not know I would be black.  He actually is quite insightful and inquired about practice changes related to the combination of several groups.  He is concerned about whether his blood pressure is under good control.  No prior history of CAD or other cardiac issues.  In reviewing records from Dr. Mare Ferrari he was carried along as a benign hypertensive heart disease patient without heart failure.  He denies interval development of orthopnea, PND, leg swelling, and dyspnea on exertion.  He is followed at [facility] for metastatic prostate cancer.  He believes his metastases are in the lungs.  He is being treated with Zytiga and also receiving Lupron every 6 months.  With reference to blood pressure, Dr. Sherryl Barters last prescription was for 50 mg of Lopressor daily.  The patient has increased his dose to 100 mg.  He notes that many times during the day he feels somewhat dizzy and weak.  Past Medical History:  Diagnosis Date  . Adenocarcinoma of prostate ([place])    with proximal urethral stricture, and reactivation of disease    . Bladder neck contracture    Dense bladder neck contracture and urethral submeatal stricture   . Hypercholesterolemia   . Hypertension   . Paroxysmal atrial tachycardia ([place])   . PVC (premature ventricular contraction)     Past Surgical History:  Procedure Laterality Date  . CYSTOURETHROSCOPY  03/18/2007   balloon dilation of proximal  urethral stricture, cystoscopy, and transurethral incision of proximal urethral stricture, bladder neck contracture, with urethral biopsy  --  SURGEON:  Carolan Clines, M.D.  . CYSTOURETHROSCOPY  09/30/2004   Retrograde urethrogram -- Urethral submeatal dilation. -- Balloon dilation of dense bladder neck contracture -- Collins knife incision of bladder neck contracture. -- SURGEON:  Sigmund I. Gaynelle Arabian, M.D  . HEMORRHOID SURGERY    . PROSTATECTOMY      Current Medications: Current Meds  Medication Sig  . abiraterone acetate (ZYTIGA) 250 MG tablet Take 4 tablets (1,000 mg total) by mouth once daily. Take 1 hour prior to breakfast  . Leuprolide Acetate, 6 Month, (LUPRON) 45 MG injection Inject 45 mg into the muscle every 6 (six) months.  . metoprolol (LOPRESSOR) 100 MG tablet Take 0.5 tablets (50 mg total) by mouth daily. (Patient taking differently: Take 50 mg by mouth 2 (two) times daily. )  . tamsulosin (FLOMAX) 0.4 MG CAPS capsule Take 0.4 mg by mouth daily.     Allergies:   Patient has no known allergies.   Social History   Socioeconomic History  . Marital status: Widowed    Spouse name: Not on file  . Number of children: 1  . Years of education: Not on file  . Highest education level: Not on file  Occupational History  . Occupation: Scientist, water quality , Lobbyist  Social Needs  . Financial resource strain: Not on file  .  Food insecurity:    Worry: Not on file    Inability: Not on file  . Transportation needs:    Medical: Not on file    Non-medical: Not on file  Tobacco Use  . Smoking status: Never Smoker  . Smokeless tobacco: Never Used  Substance and Sexual Activity  . Alcohol use: No    Alcohol/week: 0.0 standard drinks  . Drug use: No  . Sexual activity: Not on file  Lifestyle  . Physical activity:    Days per week: Not on file    Minutes per session: Not on file  . Stress: Not on file  Relationships  . Social connections:    Talks on phone: Not on file      Gets together: Not on file    Attends religious service: Not on file    Active member of club or organization: Not on file    Attends meetings of clubs or organizations: Not on file    Relationship status: Not on file  Other Topics Concern  . Not on file  Social History Narrative  . Not on file     Family History: The patient's family history includes Diabetes in his mother; Heart attack in his father and sister; Heart disease in his father; Hypertension in his sister; Prostate cancer in his brother.  ROS:   Please see the history of present illness.    Hearing loss, snoring, difficulty urinating, difficulty with balance, and easy bruising.  All other systems reviewed and are negative.  EKGs/Labs/Other Studies Reviewed:    The following studies were reviewed today: No recent cardiac imaging or functional testing.  EKG:  EKG is  ordered today.  The ekg ordered today demonstrates sinus rhythm with PACs and nonspecific ST-T abnormality.  Recent Labs: No results found for requested labs within last 8760 hours.  Recent Lipid Panel    Component Value Date/Time   CHOL 146 12/07/2014 0934   TRIG 268.0 (H) 12/07/2014 0934   HDL 32.50 (L) 12/07/2014 0934   CHOLHDL 4 12/07/2014 0934   VLDL 53.6 (H) 12/07/2014 0934   LDLCALC 59 04/14/2014 1211   LDLDIRECT 77.0 12/07/2014 0934    Physical Exam:    VS:  BP 102/70   Pulse 68   Ht 5\' 9"  (1.753 m)   Wt 172 lb 3.2 oz (78.1 kg)   BMI 25.43 kg/m     Wt Readings from Last 3 Encounters:  09/01/18 172 lb 3.2 oz (78.1 kg)  09/20/15 187 lb (84.8 kg)  01/30/15 192 lb (87.1 kg)     GEN:  Well nourished, well developed in no acute distress HEENT: Normal NECK: No JVD. LYMPHATICS: No lymphadenopathy CARDIAC: RRR, no murmur, no gallop, no edema. VASCULAR: 2+ bilateral radial and carotid pulses.  No bruits. RESPIRATORY:  Clear to auscultation without rales, wheezing or rhonchi  ABDOMEN: Soft, non-tender, non-distended, No pulsatile  mass, MUSCULOSKELETAL: No deformity  SKIN: Warm and dry NEUROLOGIC:  Alert and oriented x 3 PSYCHIATRIC:  Normal affect   ASSESSMENT:    1. Benign hypertensive heart disease without heart failure   2. Pure hypercholesterolemia   3. PVC's (premature ventricular contractions)    PLAN:    In order of problems listed above:  1. Pressures well controlled.  He may be experiencing relatively low blood pressures due to the large 100 mg dose of metoprolol each morning.  This may be causing lightheadedness and dizziness.  He needs to take the medication twice daily and I have  recommended that he divide his dose. 2. Not assessed 3. No clinical issues.  I advised that the patient take 50 mg twice daily of metoprolol rather than 100 mg of metoprolol tartrate as a single daily dose.  This will even out blood levels and prevent dramatic drops in blood pressure which may be causing lightheadedness or dizziness.  Greater than 50% of the time during this office visit was spent in education, counseling, and coordination of care related to underlying disease process and testing as outlined.    Medication Adjustments/Labs and Tests Ordered: Current medicines are reviewed at length with the patient today.  Concerns regarding medicines are outlined above.  No orders of the defined types were placed in this encounter.  No orders of the defined types were placed in this encounter.   Patient Instructions  Your physician has recommended you make the following change in your medication:  TAKE METOPROLOL 100 MG  1/2 TAB TWICE DAILY    Your physician wants you to follow-up in: 8- [place] will receive a reminder letter in the mail two months in advance. If you don't receive a letter, please call our office to schedule the follow-up appointment.     Signed, Sinclair Grooms, MD  09/01/2018 1:08 PM    [facility]

## 2018-09-01 ENCOUNTER — Ambulatory Visit (INDEPENDENT_AMBULATORY_CARE_PROVIDER_SITE_OTHER): Payer: Medicare Other | Admitting: Interventional Cardiology

## 2018-09-01 ENCOUNTER — Encounter: Payer: Self-pay | Admitting: Interventional Cardiology

## 2018-09-01 VITALS — BP 102/70 | HR 68 | Ht 69.0 in | Wt 172.2 lb

## 2018-09-01 DIAGNOSIS — I119 Hypertensive heart disease without heart failure: Secondary | ICD-10-CM

## 2018-09-01 DIAGNOSIS — E7800 Pure hypercholesterolemia, unspecified: Secondary | ICD-10-CM

## 2018-09-01 DIAGNOSIS — I493 Ventricular premature depolarization: Secondary | ICD-10-CM | POA: Diagnosis not present

## 2018-09-01 NOTE — Patient Instructions (Addendum)
Your physician has recommended you make the following change in your medication:  TAKE METOPROLOL 100 MG  1/2 TAB TWICE DAILY    Your physician wants you to follow-up in: 8- [place] will receive a reminder letter in the mail two months in advance. If you don't receive a letter, please call our office to schedule the follow-up appointment.

## 2018-09-06 DIAGNOSIS — I714 Abdominal aortic aneurysm, without rupture: Secondary | ICD-10-CM | POA: Diagnosis not present

## 2018-09-06 DIAGNOSIS — C61 Malignant neoplasm of prostate: Secondary | ICD-10-CM | POA: Diagnosis not present

## 2018-09-06 DIAGNOSIS — Z9079 Acquired absence of other genital organ(s): Secondary | ICD-10-CM | POA: Diagnosis not present

## 2018-09-06 DIAGNOSIS — C7802 Secondary malignant neoplasm of left lung: Secondary | ICD-10-CM | POA: Diagnosis not present

## 2018-09-06 DIAGNOSIS — R590 Localized enlarged lymph nodes: Secondary | ICD-10-CM | POA: Diagnosis not present

## 2018-09-06 DIAGNOSIS — C7801 Secondary malignant neoplasm of right lung: Secondary | ICD-10-CM | POA: Diagnosis not present

## 2018-09-06 DIAGNOSIS — Z8546 Personal history of malignant neoplasm of prostate: Secondary | ICD-10-CM | POA: Diagnosis not present

## 2018-09-06 DIAGNOSIS — Z79899 Other long term (current) drug therapy: Secondary | ICD-10-CM | POA: Diagnosis not present

## 2018-09-06 DIAGNOSIS — R918 Other nonspecific abnormal finding of lung field: Secondary | ICD-10-CM | POA: Diagnosis not present

## 2018-09-07 DIAGNOSIS — H25812 Combined forms of age-related cataract, left eye: Secondary | ICD-10-CM | POA: Diagnosis not present

## 2018-09-09 DIAGNOSIS — E291 Testicular hypofunction: Secondary | ICD-10-CM | POA: Diagnosis not present

## 2018-09-09 DIAGNOSIS — C7800 Secondary malignant neoplasm of unspecified lung: Secondary | ICD-10-CM | POA: Diagnosis not present

## 2018-09-09 DIAGNOSIS — C61 Malignant neoplasm of prostate: Secondary | ICD-10-CM | POA: Diagnosis not present

## 2018-10-13 ENCOUNTER — Encounter (INDEPENDENT_AMBULATORY_CARE_PROVIDER_SITE_OTHER): Payer: Self-pay

## 2018-10-13 ENCOUNTER — Encounter: Payer: Self-pay | Admitting: Nurse Practitioner

## 2018-10-13 ENCOUNTER — Ambulatory Visit (INDEPENDENT_AMBULATORY_CARE_PROVIDER_SITE_OTHER): Payer: Medicare Other | Admitting: Nurse Practitioner

## 2018-10-13 VITALS — BP 180/108 | HR 97 | Ht 68.0 in | Wt 171.0 lb

## 2018-10-13 DIAGNOSIS — R5383 Other fatigue: Secondary | ICD-10-CM | POA: Diagnosis not present

## 2018-10-13 DIAGNOSIS — I119 Hypertensive heart disease without heart failure: Secondary | ICD-10-CM | POA: Diagnosis not present

## 2018-10-13 LAB — BASIC METABOLIC PANEL
BUN/Creatinine Ratio: 16 (ref 10–24)
BUN: 20 mg/dL (ref 8–27)
CO2: 25 mmol/L (ref 20–29)
Calcium: 9.5 mg/dL (ref 8.6–10.2)
Chloride: 102 mmol/L (ref 96–106)
Creatinine, Ser: 1.23 mg/dL (ref 0.76–1.27)
GFR calc Af Amer: 62 mL/min/{1.73_m2} (ref >59)
GFR calc non Af Amer: 54 mL/min/{1.73_m2} — ABNORMAL LOW (ref >59)
Glucose: 87 mg/dL (ref 65–99)
Potassium: 4.2 mmol/L (ref 3.5–5.2)
Sodium: 142 mmol/L (ref 134–144)

## 2018-10-13 LAB — CBC
Hematocrit: 36.4 % — ABNORMAL LOW (ref 37.5–51.0)
Hemoglobin: 12.3 g/dL — ABNORMAL LOW (ref 13.0–17.7)
MCH: 29.6 pg (ref 26.6–33.0)
MCHC: 33.8 g/dL (ref 31.5–35.7)
MCV: 88 fL (ref 79–97)
Platelets: 207 10*3/uL (ref 150–450)
RBC: 4.15 x10E6/uL (ref 4.14–5.80)
RDW: 12.9 % (ref 12.3–15.4)
WBC: 11 10*3/uL — ABNORMAL HIGH (ref 3.4–10.8)

## 2018-10-13 LAB — TSH: TSH: 5.08 u[IU]/mL — ABNORMAL HIGH (ref 0.450–4.500)

## 2018-10-13 LAB — HEPATIC FUNCTION PANEL
ALT: 8 IU/L (ref 0–44)
AST: 11 IU/L (ref 0–40)
Albumin: 3.9 g/dL (ref 3.5–4.7)
Alkaline Phosphatase: 92 IU/L (ref 39–117)
Bilirubin Total: 0.6 mg/dL (ref 0.0–1.2)
Bilirubin, Direct: 0.22 mg/dL (ref 0.00–0.40)
Total Protein: 6 g/dL (ref 6.0–8.5)

## 2018-10-13 MED ORDER — LISINOPRIL 2.5 MG PO TABS: 2.5000 mg | ORAL_TABLET | Freq: Every day | ORAL | 3 refills | Status: DC

## 2018-10-13 MED ORDER — METOPROLOL TARTRATE 25 MG PO TABS: 25.0000 mg | ORAL_TABLET | Freq: Two times a day (BID) | ORAL | 3 refills | Status: DC

## 2018-10-13 NOTE — Patient Instructions (Addendum)
We will be checking the following labs today - BMET, CBC, HPF and TSH  BMET with nurse visit   Medication Instructions:    Continue with your current medicines. BUT  I am stopping the current dose of Lopressor  I am starting Lopressor 25 mg to take twice a day  I am adding Lisinopril 2.5 mg to take once a day    If you need a refill on your cardiac medications before your next appointment, please call your pharmacy.     Testing/Procedures To Be Arranged:  N/A  Follow-Up:   Nurse visit in about 7 to 10 days with follow up BMET    At [facility], you and your health needs are our priority.  As part of our continuing mission to provide you with exceptional heart care, we have created designated Provider Care Teams.  These Care Teams include your primary Cardiologist (physician) and Advanced Practice Providers (APPs -  Physician Assistants and Nurse Practitioners) who all work together to provide you with the care you need, when you need it.  Special Instructions:  . Try to move around a little more in your house.   Call the [place] office at [phone] if you have any questions, problems or concerns.

## 2018-10-13 NOTE — Progress Notes (Signed)
CARDIOLOGY OFFICE NOTE  Date:  10/13/2018    Vincent Perry Date of Birth: Mar 18, 1932 Medical Record #[id number]  PCP:  Patient, No Pcp Per  Cardiologist:  Tamala Julian    Chief Complaint  Patient presents with  . Follow-up    Work in for numerous somatic complaints - seen for Dr. Tamala Julian    History of Present Illness: Vincent Perry is a 84 y.o. male who presents today for a work in visit. Seen for Dr. Tamala Julian.   He has a history of HTN, HLD, PVCs, metastatic prostate cancer (followed at [facility]). No known CAD noted.   Seen previously by Dr. Mare Ferrari in 2016. Saw Dr. Tamala Julian this past October to re-establish his cardiology care. His medicines were adjusted to minimize dizziness.   Comes in today. Here with grand daughter and son. This is an early visit back to the clinic. Lots of issues. Family notes profound fatigue over the past several weeks. He lives by himself - has a girlfriend that "comes and goes". He has had a fall - laid on the floor for about 2 hours before found. He stopped Metoprolol on his own 3 days ago due to fatigue - was on 50 mg BID.  BP is quite elevated. His cancer medicine has been stopped from [facility] ?due to fatigue. But was told he needed "eyes laid on him". Asking about getting physical therapy. Really not seeing PCP - says he "just does not go see anyone".   Past Medical History:  Diagnosis Date  . Adenocarcinoma of prostate ([place])    with proximal urethral stricture, and reactivation of disease    . Bladder neck contracture    Dense bladder neck contracture and urethral submeatal stricture   . Hypercholesterolemia   . Hypertension   . Paroxysmal atrial tachycardia ([place])   . PVC (premature ventricular contraction)     Past Surgical History:  Procedure Laterality Date  . CYSTOURETHROSCOPY  03/18/2007   balloon dilation of proximal urethral stricture, cystoscopy, and transurethral incision of proximal urethral stricture, bladder neck contracture, with urethral biopsy   --  SURGEON:  Carolan Clines, M.D.  . CYSTOURETHROSCOPY  09/30/2004   Retrograde urethrogram -- Urethral submeatal dilation. -- Balloon dilation of dense bladder neck contracture -- Collins knife incision of bladder neck contracture. -- SURGEON:  Sigmund I. Gaynelle Arabian, M.D  . HEMORRHOID SURGERY    . PROSTATECTOMY       Medications: Current Meds  Medication Sig  . abiraterone acetate (ZYTIGA) 250 MG tablet Take 4 tablets (1,000 mg total) by mouth once daily. Take 1 hour prior to breakfast  . calcium carbonate (OSCAL) 1500 (600 Ca) MG TABS tablet Take 600 mg of elemental calcium by mouth daily with breakfast.  . Leuprolide Acetate, 6 Month, (LUPRON) 45 MG injection Inject 45 mg into the muscle every 6 (six) months.  . predniSONE (DELTASONE) 5 MG tablet Take 5 mg by mouth 2 (two) times daily with a meal.  . tamsulosin (FLOMAX) 0.4 MG CAPS capsule Take 0.4 mg by mouth daily.  . [DISCONTINUED] metoprolol (LOPRESSOR) 100 MG tablet Take 0.5 tablets (50 mg total) by mouth daily. (Patient taking differently: Take 50 mg by mouth 2 (two) times daily. )     Allergies: No Known Allergies  Social History: The patient  reports that he has never smoked. He has never used smokeless tobacco. He reports that he does not drink alcohol or use drugs.   Family History: The patient's family history includes Diabetes  in his mother; Heart attack in his father and sister; Heart disease in his father; Hypertension in his sister; Prostate cancer in his brother.   Review of Systems: Please see the history of present illness.   Otherwise, the review of systems is positive for none.   All other systems are reviewed and negative.   Physical Exam: VS:  BP (!) 180/108 (BP Location: Left Arm, Patient Position: Sitting, Cuff Size: Normal)   Pulse 97   Ht 5\' 8"  (1.727 m)   Wt 171 lb (77.6 kg)   SpO2 98% Comment: at rest  BMI 26.00 kg/m  .  BMI Body mass index is 26 kg/m.  Wt Readings from Last 3 Encounters:    10/13/18 171 lb (77.6 kg)  09/01/18 172 lb 3.2 oz (78.1 kg)  09/20/15 187 lb (84.8 kg)   BP is 170/80 by me.   General: Chronically ill. Looks puny to me. He is in no acute distress.   HEENT: Normal.  Neck: Supple, no JVD, carotid bruits, or masses noted.  Cardiac: Regular rate and rhythm. No murmurs, rubs, or gallops. No edema.  Respiratory:  Lungs are clear to auscultation bilaterally with normal work of breathing.  GI: Soft and nontender.  MS: No deformity or atrophy. Gait and ROM intact.  Skin: Warm and dry. Color is pale Neuro:  Strength and sensation are intact and no gross focal deficits noted.  Psych: Alert, appropriate and with normal affect.   LABORATORY DATA:  EKG:  EKG is not ordered today.  Lab Results  Component Value Date   WBC 9.3 12/07/2014   HGB 13.0 12/07/2014   HCT 38.5 (L) 12/07/2014   PLT 179.0 12/07/2014   GLUCOSE 106 (H) 12/07/2014   CHOL 146 12/07/2014   TRIG 268.0 (H) 12/07/2014   HDL 32.50 (L) 12/07/2014   LDLDIRECT 77.0 12/07/2014   LDLCALC 59 04/14/2014   ALT 10 12/07/2014   AST 13 12/07/2014   NA 141 12/07/2014   K 4.6 12/07/2014   CL 106 12/07/2014   CREATININE 1.05 12/07/2014   BUN 14 12/07/2014   CO2 28 12/07/2014     BNP (last 3 results) No results for input(s): BNP in the last 8760 hours.  ProBNP (last 3 results) No results for input(s): PROBNP in the last 8760 hours.   Other Studies Reviewed Today:  Recent scans from [facility] noted - he has an AAA noted 4.5cm - have made them aware.   Assessment/Plan:  1. HTN - uncontrolled - will restart Lopressor at lower dose - 25 mg BID. Adding lisinopril 2.5 mg daily. Will bring back for nurse visit. Need to try and limit salt.   2. Profound fatigue - I suspect this is more a sequale from his cancer treatment which is now on hold. Needs labs today. He is not interested in PT - would defer PT to PCP.   3. HLD - not discussed  4. Atherosclerosis noted on prior CT along with AAA -  looks to be a poor candidate at this time for any invasive procedure/surgery. He has no active symptoms. Needs good BP control.   5. Metastatic prostate cancer  6. Failure to thrive - not sure this is going to be reversible. Overall situation looks tenuous to me. May need to start end of life/palliative care conversations.    Current medicines are reviewed with the patient today.  The patient does not have concerns regarding medicines other than what has been noted above.  The following changes  have been made:  See above.  Labs/ tests ordered today include:    Orders Placed This Encounter  Procedures  . Basic metabolic panel  . CBC  . TSH  . Hepatic function panel  . Basic metabolic panel     Disposition:   FU with nurse visit in about 7 to 10 days with repeat BMET.   Patient is agreeable to this plan and will call if any problems develop in the interim.   SignedTruitt Merle, NP  10/13/2018 11:37 AM  [facility] [address] [place], [place]  [address] Phone: [phone] Fax: [phone]

## 2018-10-14 ENCOUNTER — Other Ambulatory Visit: Payer: Self-pay | Admitting: *Deleted

## 2018-10-14 DIAGNOSIS — R5383 Other fatigue: Secondary | ICD-10-CM

## 2018-10-25 ENCOUNTER — Other Ambulatory Visit: Payer: Medicare Other | Admitting: *Deleted

## 2018-10-25 ENCOUNTER — Ambulatory Visit (INDEPENDENT_AMBULATORY_CARE_PROVIDER_SITE_OTHER): Payer: Medicare Other

## 2018-10-25 ENCOUNTER — Ambulatory Visit: Payer: Medicare Other

## 2018-10-25 VITALS — BP 140/84 | HR 80 | Wt 171.0 lb

## 2018-10-25 DIAGNOSIS — I119 Hypertensive heart disease without heart failure: Secondary | ICD-10-CM | POA: Diagnosis not present

## 2018-10-25 DIAGNOSIS — R5383 Other fatigue: Secondary | ICD-10-CM

## 2018-10-25 NOTE — Patient Instructions (Addendum)
Medication Instructions:   If you need a refill on your cardiac medications before your next appointment, please call your pharmacy.   Lab work: Had BMET today  If you have labs (blood work) drawn today and your tests are completely normal, you will receive your results only by: Marland Kitchen MyChart Message (if you have MyChart) OR . A paper copy in the mail If you have any lab test that is abnormal or we need to change your treatment, we will call you to review the results.   Testing/Procedures: None ordered    Follow-Up: Call and follow up with Dr. Tasia Catchings with [facility].   At [facility], you and your health needs are our priority.  As part of our continuing mission to provide you with exceptional heart care, we have created designated Provider Care Teams.  These Care Teams include your primary Cardiologist (physician) and Advanced Practice Providers (APPs -  Physician Assistants and Nurse Practitioners) who all work together to provide you with the care you need, when you need it.

## 2018-10-25 NOTE — Progress Notes (Signed)
1.) Reason for visit: BP check  2.) Name of MD requesting visit: Truitt Merle NP   3.) H&P: Pt seen in the office 10/13/18 and BP meds were changed based on BP.. pt to follow up today for repeat BMET and BP check.   4.) Assessment and plan per MD:   Spoke with Truitt Merle NP re: the patients BP which today is 140/84 and 146/81, HR 80.. pt says he is feeling well.. Dr. Tasia Catchings at [facility] has made new med changes recently and following up with him 11/12/18.Marland Kitchen Per Cecille Rubin, she agrees with the changes made and pt to continue BP follow up with Dr. Tasia Catchings. Will contact the family with his BMET results drawn today after we receive them and Cecille Rubin has a chance to review it.. Pt and family verbalized understanding and agree to f/u with Dr. Tasia Catchings. Will continue to monitor and record BP.

## 2018-10-26 LAB — BASIC METABOLIC PANEL
BUN/Creatinine Ratio: 17 (ref 10–24)
BUN: 15 mg/dL (ref 8–27)
CO2: 24 mmol/L (ref 20–29)
Calcium: 9 mg/dL (ref 8.6–10.2)
Chloride: 103 mmol/L (ref 96–106)
Creatinine, Ser: 0.88 mg/dL (ref 0.76–1.27)
GFR calc Af Amer: 91 mL/min/{1.73_m2} (ref >59)
GFR calc non Af Amer: 79 mL/min/{1.73_m2} (ref >59)
Glucose: 94 mg/dL (ref 65–99)
Potassium: 4.1 mmol/L (ref 3.5–5.2)
Sodium: 141 mmol/L (ref 134–144)

## 2018-10-26 LAB — TSH: TSH: 2.84 u[IU]/mL (ref 0.450–4.500)

## 2019-01-04 ENCOUNTER — Other Ambulatory Visit: Payer: Self-pay | Admitting: Nurse Practitioner

## 2019-02-09 DEATH — deceased
